# Patient Record
Sex: Male | Born: 1945 | Race: White | Hispanic: No | Marital: Married | Smoking: Former smoker
Health system: Southern US, Community
[De-identification: ages and names within clinical notes are randomized; demographics above are authoritative.]

## PROBLEM LIST (undated history)

## (undated) DIAGNOSIS — I2510 Atherosclerotic heart disease of native coronary artery without angina pectoris: Secondary | ICD-10-CM

## (undated) DIAGNOSIS — G4733 Obstructive sleep apnea (adult) (pediatric): Secondary | ICD-10-CM

## (undated) DIAGNOSIS — K219 Gastro-esophageal reflux disease without esophagitis: Secondary | ICD-10-CM

## (undated) DIAGNOSIS — I10 Essential (primary) hypertension: Secondary | ICD-10-CM

## (undated) DIAGNOSIS — E785 Hyperlipidemia, unspecified: Secondary | ICD-10-CM

## (undated) DIAGNOSIS — Z9889 Other specified postprocedural states: Secondary | ICD-10-CM

## (undated) DIAGNOSIS — C61 Malignant neoplasm of prostate: Secondary | ICD-10-CM

## (undated) HISTORY — DX: Hyperlipidemia, unspecified: E78.5

## (undated) HISTORY — DX: Obstructive sleep apnea (adult) (pediatric): G47.33

## (undated) HISTORY — DX: Essential (primary) hypertension: I10

## (undated) HISTORY — DX: Malignant neoplasm of prostate: C61

## (undated) HISTORY — PX: CORONARY STENT PLACEMENT: SHX1402

## (undated) HISTORY — DX: Other specified postprocedural states: Z98.890

## (undated) HISTORY — PX: TOTAL HIP ARTHROPLASTY: SHX124

## (undated) HISTORY — DX: Atherosclerotic heart disease of native coronary artery without angina pectoris: I25.10

---

## 2000-11-25 HISTORY — PX: CORONARY ARTERY BYPASS GRAFT: SHX141

## 2000-12-08 ENCOUNTER — Encounter: Payer: Self-pay | Admitting: Cardiology

## 2000-12-08 ENCOUNTER — Ambulatory Visit (HOSPITAL_COMMUNITY): Admission: RE | Admit: 2000-12-08 | Discharge: 2000-12-09 | Payer: Self-pay | Admitting: Cardiology

## 2001-10-29 ENCOUNTER — Inpatient Hospital Stay (HOSPITAL_COMMUNITY): Admission: AD | Admit: 2001-10-29 | Discharge: 2001-11-07 | Payer: Self-pay | Admitting: Cardiology

## 2001-11-02 ENCOUNTER — Encounter: Payer: Self-pay | Admitting: Thoracic Surgery (Cardiothoracic Vascular Surgery)

## 2001-11-03 ENCOUNTER — Encounter: Payer: Self-pay | Admitting: Thoracic Surgery (Cardiothoracic Vascular Surgery)

## 2001-11-04 ENCOUNTER — Encounter: Payer: Self-pay | Admitting: Thoracic Surgery (Cardiothoracic Vascular Surgery)

## 2001-11-05 ENCOUNTER — Encounter: Payer: Self-pay | Admitting: Cardiology

## 2001-11-17 ENCOUNTER — Inpatient Hospital Stay (HOSPITAL_COMMUNITY): Admission: EM | Admit: 2001-11-17 | Discharge: 2001-11-19 | Payer: Self-pay | Admitting: *Deleted

## 2001-11-17 ENCOUNTER — Encounter: Payer: Self-pay | Admitting: *Deleted

## 2001-11-17 ENCOUNTER — Encounter: Payer: Self-pay | Admitting: Cardiology

## 2002-06-06 ENCOUNTER — Encounter: Payer: Self-pay | Admitting: Emergency Medicine

## 2002-06-06 ENCOUNTER — Emergency Department (HOSPITAL_COMMUNITY): Admission: EM | Admit: 2002-06-06 | Discharge: 2002-06-06 | Payer: Self-pay | Admitting: Emergency Medicine

## 2002-08-15 ENCOUNTER — Observation Stay (HOSPITAL_COMMUNITY): Admission: EM | Admit: 2002-08-15 | Discharge: 2002-08-16 | Payer: Self-pay | Admitting: Emergency Medicine

## 2004-09-05 ENCOUNTER — Observation Stay (HOSPITAL_COMMUNITY): Admission: EM | Admit: 2004-09-05 | Discharge: 2004-09-06 | Payer: Self-pay | Admitting: Emergency Medicine

## 2005-03-26 ENCOUNTER — Inpatient Hospital Stay (HOSPITAL_COMMUNITY): Admission: RE | Admit: 2005-03-26 | Discharge: 2005-03-30 | Payer: Self-pay | Admitting: Orthopedic Surgery

## 2005-09-06 ENCOUNTER — Emergency Department (HOSPITAL_COMMUNITY): Admission: EM | Admit: 2005-09-06 | Discharge: 2005-09-06 | Payer: Self-pay | Admitting: Emergency Medicine

## 2006-12-29 ENCOUNTER — Ambulatory Visit: Payer: Self-pay | Admitting: Internal Medicine

## 2007-01-12 ENCOUNTER — Ambulatory Visit: Payer: Self-pay | Admitting: Internal Medicine

## 2007-01-12 ENCOUNTER — Ambulatory Visit: Payer: Self-pay

## 2007-01-12 LAB — CONVERTED CEMR LAB
ALT: 51 units/L — ABNORMAL HIGH (ref 0–40)
AST: 40 units/L — ABNORMAL HIGH (ref 0–37)
Albumin: 3.7 g/dL (ref 3.5–5.2)
Alkaline Phosphatase: 53 units/L (ref 39–117)
BUN: 17 mg/dL (ref 6–23)
Bilirubin, Direct: 0.1 mg/dL (ref 0.0–0.3)
CO2: 30 meq/L (ref 19–32)
Calcium: 9.3 mg/dL (ref 8.4–10.5)
Chloride: 105 meq/L (ref 96–112)
Cholesterol: 139 mg/dL (ref 0–200)
Creatinine, Ser: 0.8 mg/dL (ref 0.4–1.5)
GFR calc Af Amer: 128 mL/min
GFR calc non Af Amer: 106 mL/min
Glucose, Bld: 96 mg/dL (ref 70–99)
HDL: 41.6 mg/dL (ref >39.0)
LDL Cholesterol: 78 mg/dL (ref 0–99)
Potassium: 4.4 meq/L (ref 3.5–5.1)
Sodium: 142 meq/L (ref 135–145)
Total Bilirubin: 0.6 mg/dL (ref 0.3–1.2)
Total CHOL/HDL Ratio: 3.3
Total Protein: 6.8 g/dL (ref 6.0–8.3)
Triglycerides: 97 mg/dL (ref 0–149)
VLDL: 19 mg/dL (ref 0–40)

## 2007-02-09 ENCOUNTER — Ambulatory Visit: Payer: Self-pay | Admitting: Pulmonary Disease

## 2007-03-20 ENCOUNTER — Ambulatory Visit: Payer: Self-pay | Admitting: Pulmonary Disease

## 2007-03-20 ENCOUNTER — Ambulatory Visit (HOSPITAL_BASED_OUTPATIENT_CLINIC_OR_DEPARTMENT_OTHER): Admission: RE | Admit: 2007-03-20 | Discharge: 2007-03-20 | Payer: Self-pay | Admitting: Pulmonary Disease

## 2007-04-13 ENCOUNTER — Ambulatory Visit: Payer: Self-pay | Admitting: Pulmonary Disease

## 2007-06-15 ENCOUNTER — Ambulatory Visit: Payer: Self-pay | Admitting: Internal Medicine

## 2007-06-15 LAB — CONVERTED CEMR LAB
ALT: 92 units/L — ABNORMAL HIGH (ref 0–53)
AST: 66 units/L — ABNORMAL HIGH (ref 0–37)
Albumin: 3.7 g/dL (ref 3.5–5.2)
Alkaline Phosphatase: 56 units/L (ref 39–117)
BUN: 17 mg/dL (ref 6–23)
Bilirubin, Direct: 0.1 mg/dL (ref 0.0–0.3)
CO2: 32 meq/L (ref 19–32)
Calcium: 9 mg/dL (ref 8.4–10.5)
Chloride: 104 meq/L (ref 96–112)
Cholesterol: 128 mg/dL (ref 0–200)
Creatinine, Ser: 1 mg/dL (ref 0.4–1.5)
GFR calc Af Amer: 99 mL/min
GFR calc non Af Amer: 82 mL/min
Glucose, Bld: 95 mg/dL (ref 70–99)
HDL: 37.3 mg/dL — ABNORMAL LOW (ref >39.0)
LDL Cholesterol: 66 mg/dL (ref 0–99)
Potassium: 4.2 meq/L (ref 3.5–5.1)
Sodium: 141 meq/L (ref 135–145)
Total Bilirubin: 0.8 mg/dL (ref 0.3–1.2)
Total CHOL/HDL Ratio: 3.4
Total Protein: 6.8 g/dL (ref 6.0–8.3)
Triglycerides: 126 mg/dL (ref 0–149)
VLDL: 25 mg/dL (ref 0–40)

## 2007-07-13 ENCOUNTER — Ambulatory Visit: Payer: Self-pay | Admitting: Internal Medicine

## 2008-01-18 ENCOUNTER — Ambulatory Visit: Payer: Self-pay | Admitting: Internal Medicine

## 2008-02-01 ENCOUNTER — Ambulatory Visit: Payer: Self-pay | Admitting: Internal Medicine

## 2008-02-01 LAB — CONVERTED CEMR LAB
ALT: 63 units/L — ABNORMAL HIGH (ref 0–53)
AST: 52 units/L — ABNORMAL HIGH (ref 0–37)
Albumin: 3.6 g/dL (ref 3.5–5.2)
Alkaline Phosphatase: 51 units/L (ref 39–117)
BUN: 12 mg/dL (ref 6–23)
Bilirubin, Direct: 0.2 mg/dL (ref 0.0–0.3)
CO2: 31 meq/L (ref 19–32)
Calcium: 9.2 mg/dL (ref 8.4–10.5)
Chloride: 104 meq/L (ref 96–112)
Cholesterol: 105 mg/dL (ref 0–200)
Creatinine, Ser: 0.9 mg/dL (ref 0.4–1.5)
GFR calc Af Amer: 111 mL/min
GFR calc non Af Amer: 92 mL/min
Glucose, Bld: 111 mg/dL — ABNORMAL HIGH (ref 70–99)
HDL: 33.5 mg/dL — ABNORMAL LOW (ref >39.0)
LDL Cholesterol: 54 mg/dL (ref 0–99)
PSA: 3.34 ng/mL (ref 0.10–4.00)
Potassium: 4.2 meq/L (ref 3.5–5.1)
Sodium: 141 meq/L (ref 135–145)
Total Bilirubin: 1.1 mg/dL (ref 0.3–1.2)
Total CHOL/HDL Ratio: 3.1
Total Protein: 6.7 g/dL (ref 6.0–8.3)
Triglycerides: 87 mg/dL (ref 0–149)
VLDL: 17 mg/dL (ref 0–40)

## 2008-09-19 ENCOUNTER — Ambulatory Visit: Payer: Self-pay | Admitting: Internal Medicine

## 2008-10-03 ENCOUNTER — Ambulatory Visit: Payer: Self-pay | Admitting: Internal Medicine

## 2008-10-03 LAB — CONVERTED CEMR LAB
ALT: 53 units/L (ref 0–53)
AST: 41 units/L — ABNORMAL HIGH (ref 0–37)
Albumin: 3.8 g/dL (ref 3.5–5.2)
Alkaline Phosphatase: 58 units/L (ref 39–117)
BUN: 14 mg/dL (ref 6–23)
Bilirubin, Direct: 0.2 mg/dL (ref 0.0–0.3)
CO2: 30 meq/L (ref 19–32)
Calcium: 9.3 mg/dL (ref 8.4–10.5)
Chloride: 105 meq/L (ref 96–112)
Cholesterol: 106 mg/dL (ref 0–200)
Creatinine, Ser: 0.9 mg/dL (ref 0.4–1.5)
GFR calc Af Amer: 111 mL/min
GFR calc non Af Amer: 91 mL/min
Glucose, Bld: 98 mg/dL (ref 70–99)
HDL: 27.5 mg/dL — ABNORMAL LOW (ref >39.0)
LDL Cholesterol: 54 mg/dL (ref 0–99)
Potassium: 4.2 meq/L (ref 3.5–5.1)
Sodium: 141 meq/L (ref 135–145)
Total Bilirubin: 1.3 mg/dL — ABNORMAL HIGH (ref 0.3–1.2)
Total CHOL/HDL Ratio: 3.9
Total Protein: 6.7 g/dL (ref 6.0–8.3)
Triglycerides: 121 mg/dL (ref 0–149)
VLDL: 24 mg/dL (ref 0–40)

## 2009-04-21 ENCOUNTER — Encounter (INDEPENDENT_AMBULATORY_CARE_PROVIDER_SITE_OTHER): Payer: Self-pay | Admitting: *Deleted

## 2009-05-04 ENCOUNTER — Telehealth: Payer: Self-pay | Admitting: Internal Medicine

## 2009-05-15 ENCOUNTER — Encounter: Payer: Self-pay | Admitting: Internal Medicine

## 2009-07-03 ENCOUNTER — Ambulatory Visit: Payer: Self-pay | Admitting: Internal Medicine

## 2009-07-03 DIAGNOSIS — I10 Essential (primary) hypertension: Secondary | ICD-10-CM

## 2009-07-03 DIAGNOSIS — E785 Hyperlipidemia, unspecified: Secondary | ICD-10-CM | POA: Insufficient documentation

## 2009-07-03 DIAGNOSIS — I498 Other specified cardiac arrhythmias: Secondary | ICD-10-CM

## 2009-07-03 DIAGNOSIS — I25810 Atherosclerosis of coronary artery bypass graft(s) without angina pectoris: Secondary | ICD-10-CM

## 2009-07-10 LAB — CONVERTED CEMR LAB
ALT: 36 units/L (ref 0–53)
AST: 33 units/L (ref 0–37)
Albumin: 3.9 g/dL (ref 3.5–5.2)
Alkaline Phosphatase: 55 units/L (ref 39–117)
BUN: 15 mg/dL (ref 6–23)
Bilirubin, Direct: 0.1 mg/dL (ref 0.0–0.3)
CO2: 30 meq/L (ref 19–32)
Calcium: 9.3 mg/dL (ref 8.4–10.5)
Chloride: 106 meq/L (ref 96–112)
Cholesterol: 118 mg/dL (ref 0–200)
Creatinine, Ser: 0.8 mg/dL (ref 0.4–1.5)
GFR calc non Af Amer: 104.5 mL/min (ref >60)
Glucose, Bld: 91 mg/dL (ref 70–99)
HDL: 36.6 mg/dL — ABNORMAL LOW (ref >39.00)
LDL Cholesterol: 64 mg/dL (ref 0–99)
Potassium: 4.2 meq/L (ref 3.5–5.1)
Sodium: 141 meq/L (ref 135–145)
Total Bilirubin: 0.8 mg/dL (ref 0.3–1.2)
Total CHOL/HDL Ratio: 3
Total Protein: 7.3 g/dL (ref 6.0–8.3)
Triglycerides: 89 mg/dL (ref 0.0–149.0)
VLDL: 17.8 mg/dL (ref 0.0–40.0)

## 2009-09-18 ENCOUNTER — Encounter: Payer: Self-pay | Admitting: Internal Medicine

## 2009-10-05 ENCOUNTER — Telehealth: Payer: Self-pay | Admitting: Internal Medicine

## 2010-01-01 ENCOUNTER — Encounter: Payer: Self-pay | Admitting: Internal Medicine

## 2010-07-09 ENCOUNTER — Ambulatory Visit: Payer: Self-pay | Admitting: Internal Medicine

## 2010-07-23 ENCOUNTER — Encounter: Payer: Self-pay | Admitting: Internal Medicine

## 2010-07-23 ENCOUNTER — Ambulatory Visit: Payer: Self-pay | Admitting: Internal Medicine

## 2010-07-23 ENCOUNTER — Ambulatory Visit (HOSPITAL_COMMUNITY): Admission: RE | Admit: 2010-07-23 | Discharge: 2010-07-23 | Payer: Self-pay | Admitting: Internal Medicine

## 2010-07-23 ENCOUNTER — Ambulatory Visit: Payer: Self-pay | Admitting: Cardiology

## 2010-07-23 ENCOUNTER — Ambulatory Visit: Payer: Self-pay

## 2010-08-03 LAB — CONVERTED CEMR LAB
ALT: 59 units/L — ABNORMAL HIGH (ref 0–53)
AST: 45 units/L — ABNORMAL HIGH (ref 0–37)
Albumin: 4 g/dL (ref 3.5–5.2)
Alkaline Phosphatase: 60 units/L (ref 39–117)
BUN: 16 mg/dL (ref 6–23)
Basophils Absolute: 0 10*3/uL (ref 0.0–0.1)
Basophils Relative: 0.5 % (ref 0.0–3.0)
Bilirubin, Direct: 0.1 mg/dL (ref 0.0–0.3)
CO2: 29 meq/L (ref 19–32)
Calcium: 9.6 mg/dL (ref 8.4–10.5)
Chloride: 102 meq/L (ref 96–112)
Cholesterol: 215 mg/dL — ABNORMAL HIGH (ref 0–200)
Creatinine, Ser: 0.8 mg/dL (ref 0.4–1.5)
Direct LDL: 144.9 mg/dL
Eosinophils Absolute: 0.1 10*3/uL (ref 0.0–0.7)
Eosinophils Relative: 1 % (ref 0.0–5.0)
GFR calc non Af Amer: 98.43 mL/min (ref >60)
Glucose, Bld: 89 mg/dL (ref 70–99)
HCT: 43.9 % (ref 39.0–52.0)
HDL: 38.5 mg/dL — ABNORMAL LOW (ref >39.00)
Hemoglobin: 15.1 g/dL (ref 13.0–17.0)
Lymphocytes Relative: 21.5 % (ref 12.0–46.0)
Lymphs Abs: 2 10*3/uL (ref 0.7–4.0)
MCHC: 34.4 g/dL (ref 30.0–36.0)
MCV: 95.6 fL (ref 78.0–100.0)
Monocytes Absolute: 0.9 10*3/uL (ref 0.1–1.0)
Monocytes Relative: 9.3 % (ref 3.0–12.0)
Neutro Abs: 6.2 10*3/uL (ref 1.4–7.7)
Neutrophils Relative %: 67.7 % (ref 43.0–77.0)
Platelets: 219 10*3/uL (ref 150.0–400.0)
Potassium: 4.9 meq/L (ref 3.5–5.1)
RBC: 4.59 M/uL (ref 4.22–5.81)
RDW: 13 % (ref 11.5–14.6)
Sodium: 140 meq/L (ref 135–145)
Total Bilirubin: 0.7 mg/dL (ref 0.3–1.2)
Total CHOL/HDL Ratio: 6
Total Protein: 7.1 g/dL (ref 6.0–8.3)
Triglycerides: 265 mg/dL — ABNORMAL HIGH (ref 0.0–149.0)
VLDL: 53 mg/dL — ABNORMAL HIGH (ref 0.0–40.0)
WBC: 9.2 10*3/uL (ref 4.5–10.5)

## 2010-08-13 ENCOUNTER — Ambulatory Visit: Payer: Self-pay | Admitting: Cardiovascular Disease

## 2010-09-26 ENCOUNTER — Telehealth (INDEPENDENT_AMBULATORY_CARE_PROVIDER_SITE_OTHER): Payer: Self-pay | Admitting: *Deleted

## 2010-10-12 ENCOUNTER — Telehealth (INDEPENDENT_AMBULATORY_CARE_PROVIDER_SITE_OTHER): Payer: Self-pay | Admitting: Pharmacist

## 2010-10-17 ENCOUNTER — Ambulatory Visit: Payer: Self-pay | Admitting: Internal Medicine

## 2010-10-17 DIAGNOSIS — E7800 Pure hypercholesterolemia, unspecified: Secondary | ICD-10-CM | POA: Insufficient documentation

## 2010-10-22 ENCOUNTER — Ambulatory Visit: Payer: Self-pay | Admitting: Cardiology

## 2010-10-22 DIAGNOSIS — E782 Mixed hyperlipidemia: Secondary | ICD-10-CM

## 2010-10-23 LAB — CONVERTED CEMR LAB
ALT: 56 units/L — ABNORMAL HIGH (ref 0–53)
AST: 41 units/L — ABNORMAL HIGH (ref 0–37)
Albumin: 4.3 g/dL (ref 3.5–5.2)
Alkaline Phosphatase: 56 units/L (ref 39–117)
Bilirubin, Direct: 0.2 mg/dL (ref 0.0–0.3)
Cholesterol: 139 mg/dL (ref 0–200)
HDL: 47 mg/dL (ref >39)
Indirect Bilirubin: 0.6 mg/dL (ref 0.0–0.9)
LDL Cholesterol: 69 mg/dL (ref 0–99)
PSA: 0.26 ng/mL (ref <=4.00)
Total Bilirubin: 0.8 mg/dL (ref 0.3–1.2)
Total CHOL/HDL Ratio: 3
Total CK: 195 units/L (ref 7–232)
Total Protein: 6.7 g/dL (ref 6.0–8.3)
Triglycerides: 116 mg/dL (ref <150)
VLDL: 23 mg/dL (ref 0–40)

## 2010-12-25 NOTE — Progress Notes (Signed)
  Phone Note Call from Patient   Caller: Patient Call For: Lipid Clinic Summary of Call: Patient called to reschedule his lipid labwork.  I have rescheduled it for Wednesday, Nov 23rd, and patient has appt with Lipid clinic on Monday, Nov 29th.  He was given Crestor 10 mg samples at his initial lipid visit but will run out of these the Monday before his labwork, so I have instructed patient to pick up an additional 7 tablets when he comes in on Wednesday for labwork.  He was instructed to take these every MWF, so they will last him through Monday when he will be in for his lipid clinic visit.   Initial call taken by: Eda Keys,  October 12, 2010 8:48 AM

## 2010-12-25 NOTE — Miscellaneous (Signed)
Summary: Orders Update  Clinical Lists Changes  Problems: Added new problem of SPECIAL SCREENING MALIGNANT NEOPLASM OF PROSTATE (ICD-V76.44) Orders: Added new Test order of TLB-PSA (Prostate Specific Antigen) (84153-PSA) - Signed

## 2010-12-25 NOTE — Assessment & Plan Note (Signed)
Summary: new/hyperlipidemia   [name] presents to lipid clinic today. He has no complaints, but has had several statin intolerances in the past. He has previously tried Lipitor which caused him severe chest pain. He also had tried crestor (40 mg) before but had severe cramps in his hands and legs. The most recent statin he tried was pitavastatin which caused cramps in his legs and hands as well. 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 For breakfast he either eats a bacon, eggs, and cheese biscuit or cereal with black coffee. He frequently skips lunch. For dinner, he has soup and salad or baked chicken. 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 This makes exercising difficult.   Lipid Management Provider  Lyna Poser, PharmD  Preventive Screening-Counseling & Management  Caffeine-Diet-Exercise     Diet Counseling: to improve diet; diet is suboptimal     Does Patient Exercise: yes     Type of exercise: walking     Exercise Counseling: to improve exercise regimen  Medications Prior to Update: 1)  Enalapril Maleate 20 Mg Tabs (Enalapril Maleate) .... Take 1/2 Tab in Am, 1 Tab in Pm 2)  Metoprolol Tartrate 50 Mg Tabs (Metoprolol Tartrate) .... Take 1/2 Tab Two Times A Day 3)  Spironolactone-Hctz 25-25 Mg Tabs (Spironolactone-Hctz) .... 1/2 Tab  Once Daily 4)  Aspirin 81 Mg Tbec (Aspirin) .... Take One Tablet By Mouth Daily 5)  Mobic 15 Mg Tabs (Meloxicam) .... As Needed 6)  Fish Oil   Oil (Fish Oil) .... 3000mg  Once Daily 7)  Allegra 180 Mg Tabs (Fexofenadine Hcl) .... As Needed 8)  Zetia 10 Mg Tabs (Ezetimibe) .... Take One Tablet By Mouth Daily.  Current Medications (verified): 1)  Enalapril Maleate 20 Mg Tabs (Enalapril Maleate) .... Take 1/2 Tab in Am, 1 Tab in Pm 2)  Metoprolol Tartrate  50 Mg Tabs (Metoprolol Tartrate) .... Take 1/2 Tab Two Times A Day 3)  Spironolactone-Hctz 25-25 Mg Tabs (Spironolactone-Hctz) .... 1/2 Tab  Once Daily 4)  Aspirin 81 Mg Tbec (Aspirin) .... Take One Tablet By Mouth Daily 5)  Mobic 15 Mg Tabs (Meloxicam) .... As Needed 6)  Fish Oil   Oil (Fish Oil) .... 3000mg  in Am and 3000 Mg in Pm 7)  Allegra 180 Mg Tabs (Fexofenadine Hcl) .... As Needed 8)  Zetia 10 Mg Tabs (Ezetimibe) .... Take One Tablet By Mouth Daily. 9)  Crestor 10 Mg Tabs (Rosuvastatin Calcium) .... Take 1 Tablet On Monday, Wednesday, and Friday  Allergies: 1)  ! Codeine 2)  ! * Ambien 3)  ! * Statins  Past History:  Past Medical History: Last updated: 07/09/2010 1) CAD - s/p LAD wire perforation followed by CABG with LIMA-LAD 2002     --Treadmil 9/08. Good exercise tolerance. No ischemia 2) HTN 3) HL      --intolerant of all statins due to muscle aches 4) Prostate CA s/p resection 5) h/o OSA resolved with weight loss  Past Surgical History: Last updated: 06/28/2009 The patient had cardiac bypass surgery in 2002.  Colonoscopy with polypectomy.  Family History: Last updated: 06/28/2009 Significant for father with diabetes, coronary artery  disease, and hypertension. Sister died of cancer - type unknown. Mother had  arthritis.  Social History: Last updated: 06/28/2009  He lives in  [place] with his wife and he works as a Ecologist.  He has four sons.  He quit smoking in 1989 with approximately a 40  Social History: Does Patient Exercise:  yes   Vital Signs:  Patient profile:   65 year old male Pulse rate:   64 / minute BP supine:   125 / 87  Impression & Recommendations:  Problem # 1:  HYPERLIPIDEMIA-MIXED (ICD-272.4) TC 215, TG 265, HDL 38, and LDL 144. Goal LDL <70. Patient had been previously on crestor 40 mg. Discussed whether he wished to try the crestor again and take it 3 days/week or try Tricor. He agreed to try the crestor at a reduced dose  of 10 mg and to take it just on monday, wednesday, and friday. He had previously tried Co-Q10 before with past statins. He stated that they helped but didn't make the cramps go away. Encouraged him to get some more to help with the cramps. Counseled that if the muscle cramps become intolerable, to make sure and give [place] a call and we we will try Tricor instead. Counseled on lifestyle modifications. Encouraged him to exercise 30 minutes per day if possible. 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 Encouraged him to break up the workout into 10 minute increments, if he cannot exercise the full 30 minutes at one time. Counseled on diet and established goal of decreasing fast food, increasing fruits and vegetables, and eat leaner meats such as chicken or fish and to eat baked, broiled, or steamed food rather than fried. F/u in 2 months to recheck lipid panel and LFT's.  His updated medication list for this problem includes:    Zetia 10 Mg Tabs (Ezetimibe) .Marland Kitchen... Take one tablet by mouth daily.    Crestor 10 Mg Tabs (Rosuvastatin calcium) .Marland Kitchen... Take 1 tablet on monday, wednesday, and friday  Patient Instructions: 1)  Take the crestor 10 mg samples on monday, wednesday, friday 2)  Restart Co-Q10 that you had tried before 3)  Decrease fatty foods, eat more fruits and vegetables 4)  Exercise 30 minutes per day 5)  We'll see you in 2 month to check some labs 6)  Give [place] a call if the cramping becomes severe enough that you can't tolerate the crestor anymore

## 2010-12-25 NOTE — Progress Notes (Signed)
  All recent records faxed to [facility] Clinic @ [phone] [facility]  September 26, 2010 11:41 AM

## 2010-12-25 NOTE — Assessment & Plan Note (Signed)
Summary: per check out/sf   Visit Type:  Follow-up Primary Provider:  Tomasa Blase, MD  CC:  no cardiac complaints.  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  He returns, today, for routine followup. Past medical history is also notable for obesity and obstructive sleep apnea with intolerance to CPAP .   From cardiac standpoint doing very well no sob. Does have CP due to gas relieved with belching. No exertional CP. Switched from lipitor to crestor due to severe chest pains with lipitor. Couldn't tolerate Crestor either due to severe hand cramps. Switched to pitavastatin and also couldn't tolerate. Last LDL was 86 in 2/11   Notes that he has very easy bruising over the past few months. No nosebleeds or gingival bleeding.  Active at work but no regular exercise. No need for CPAP after weight loss. BP well controlled. No syncope or presyncope.  Current Medications (verified): 1)  Enalapril Maleate 20 Mg Tabs (Enalapril Maleate) .... Take 1/2 Tab in Am, 1 Tab in Pm 2)  Metoprolol Tartrate 50 Mg Tabs (Metoprolol Tartrate) .... Take 1/2 Tab Two Times A Day 3)  Spironolactone-Hctz 25-25 Mg Tabs (Spironolactone-Hctz) .... 1/2 Tab  Once Daily 4)  Aspirin 81 Mg Tbec (Aspirin) .... Take One Tablet By Mouth Daily 5)  Mobic 15 Mg Tabs (Meloxicam) .... As Needed 6)  Fish Oil   Oil (Fish Oil) .... 3000mg  Once Daily 7)  Allegra 180 Mg Tabs (Fexofenadine Hcl) .... As Needed 8)  Zetia 10 Mg Tabs (Ezetimibe) .... Take One Tablet By Mouth Daily.  Allergies: 1)  ! Codeine 2)  ! * Ambien 3)  ! * Statins  Past History:  Past Surgical History: Last updated: 06/28/2009 The patient had cardiac bypass surgery in 2002.  Colonoscopy with polypectomy.  Family  History: Last updated: 06/28/2009 Significant for father with diabetes, coronary artery  disease, and hypertension. Sister died of cancer - type unknown. Mother had  arthritis.  Social History: Last updated: 06/28/2009  He lives in [place] with his wife and he works as a Ecologist.  He has four sons.  He quit smoking in 1989 with approximately a 40  Past Medical History: 1) CAD - s/p LAD wire perforation followed by CABG with LIMA-LAD 2002     --Treadmil 9/08. Good exercise tolerance. No ischemia 2) HTN 3) HL      --intolerant of all statins due to muscle aches 4) Prostate CA s/p resection 5) h/o OSA resolved with weight loss  Review of Systems       As per HPI and past medical history; otherwise all systems negative.   Vital Signs:  Patient profile:   65 year old male Height:      72 inches Weight:      216 pounds BMI:     29.40 Pulse rate:   68 / minute BP sitting:   118 / 70  (left arm) Cuff size:   regular  Vitals Entered By: Hardin Negus, RMA (July 09, 2010 2:32 PM)  Physical Exam  General:  Gen: well appearing. no resp difficulty HEENT: normal Neck: supple. no JVD. Carotids 2+ bilat; no bruits. No lymphadenopathy or thryomegaly appreciated. Cor: PMI nondisplaced. Bradycardic and regular. No rubs, gallops, murmur. Lungs: clear Abdomen: soft, nontender, nondistended.  No hepatosplenomegaly. No bruits or masses. Good bowel sounds. Extremities: no cyanosis, clubbing, rash, edema Neuro: alert & orientedx3, cranial nerves grossly intact. moves all 4 extremities w/o difficulty. affect pleasant    Impression & Recommendations:  Problem # 1:  CAD, ARTERY BYPASS GRAFT (ICD-414.04) Stable. No evidence of ischemia. Continue current regimen. Check CBC to make sure platelets ok. Will check echo to quantify LV function.   Problem # 2:  HYPERLIPIDEMIA-MIXED (ICD-272.4) Unable to tolerate all statins. Continue zetia. Due for lipid recheck.   Other Orders: EKG  w/ Interpretation (93000) Echocardiogram (Echo)  Patient Instructions: 1)  Your physician recommends that you return for a FASTING lipid, liver, bmet, and cbc profile:  2)  Your physician has requested that you have an echocardiogram.  Echocardiography is a painless test that uses sound waves to create images of your heart. It provides your doctor with information about the size and shape of your heart and how well your heart's chambers and valves are working.  This procedure takes approximately one hour. There are no restrictions for this procedure. 3)  Your physician wants you to follow-up in:  1 year.  You will receive a reminder letter in the mail two months in advance. If you don't receive a letter, please call our office to schedule the follow-up appointment.

## 2010-12-25 NOTE — Assessment & Plan Note (Signed)
Summary: rov/sp   [name] presents to lipid clinic today for a follow up visit. He is complaining of urinary issues, specifically burning and pain. He has seen one urologist and now is seeking a second opinion after 2 rounds of antibiotics and a rocephin injection. Patient states that after completion of antibiotics, the symptoms would go away but have now returned today. He is not complaining of any muscle aches. Occasionally he gets cramps but they are minor in severity and he says they are nothing compared to when he was on the 40 mg of crestor. His diet has improved. He states he has been avoiding fried foods but did cheat on thanksgiving. He is eating more chicken, [place], salads, and has a freezer full of deer meat. Exercise has not improved. He still has a lot of issues with his hip and may be getting a hip replacement at the beginning of the year. 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  Lipid Management Provider  Lyna Poser, PharmD  Current Medications (verified): 1)  Enalapril Maleate 20 Mg Tabs (Enalapril Maleate) .... Take 1/2 Tab in Am, 1 Tab in Pm 2)  Metoprolol Tartrate 50 Mg Tabs (Metoprolol Tartrate) .... Take 1/2 Tab Two Times A Day 3)  Spironolactone-Hctz 25-25 Mg Tabs (Spironolactone-Hctz) .... 1/2 Tab  Once Daily 4)  Aspirin 81 Mg Tbec (Aspirin) .... Take One Tablet By Mouth Daily 5)  Mobic 15 Mg Tabs (Meloxicam) .... As Needed 6)  Fish Oil   Oil (Fish Oil) .... 3000mg  in Am and 3000 Mg in Pm 7)  Allegra 180 Mg Tabs (Fexofenadine Hcl) .... As Needed 8)  Zetia 10 Mg Tabs (Ezetimibe) .... Take One Tablet By Mouth Daily. 9)  Crestor 10 Mg Tabs (Rosuvastatin Calcium) .... Take 1 Tablet On Monday, Wednesday, and Friday  Allergies: 1)  ! Codeine 2)  ! * Ambien 3)  ! 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 Values have significantly improved since last visit and patient is now at goal. Will continue current regimen of crestor 10 mg monday, wednesday, and friday, zetia 10 mg daily, and CoQ10 daily. Counseled patient to call if cramps become severe. LFT's are slightly elevated (41 and 56) but have decreased since august. CK WNL. Will continue to monitor. Patient will try to exercise in 10 minute intervals with goal to work up to 30 min/day 5 days/week. Diet has improved since last visit and patient agreed to continue this. f/u 3 months.   His updated medication list for this problem includes:    Zetia 10 Mg Tabs (Ezetimibe) .Marland Kitchen... Take one tablet by mouth daily.    Crestor 10 Mg Tabs (Rosuvastatin calcium) .Marland Kitchen... Take 1 tablet on monday, wednesday, and friday  Patient Instructions: 1)  Continue crestor 10 mg monday, wednesday, and friday 2)  Continue zetia 10 mg and CoQ10 daily 3)  exercise 10 minute intervals with goal of 30 min/day 5 days/week 4)  lab appointment february 20th 8:30 am (fasting) 5)  lipid clinic february 27th 1:30 pm

## 2011-01-14 ENCOUNTER — Encounter: Payer: Self-pay | Admitting: Cardiology

## 2011-01-14 ENCOUNTER — Other Ambulatory Visit (INDEPENDENT_AMBULATORY_CARE_PROVIDER_SITE_OTHER): Payer: 59

## 2011-01-14 ENCOUNTER — Other Ambulatory Visit: Payer: Self-pay | Admitting: Cardiology

## 2011-01-14 DIAGNOSIS — Z79899 Other long term (current) drug therapy: Secondary | ICD-10-CM

## 2011-01-14 DIAGNOSIS — E785 Hyperlipidemia, unspecified: Secondary | ICD-10-CM

## 2011-01-14 LAB — HEPATIC FUNCTION PANEL
ALT: 59 U/L — ABNORMAL HIGH (ref 0–53)
AST: 33 U/L (ref 0–37)
Albumin: 3.8 g/dL (ref 3.5–5.2)
Alkaline Phosphatase: 56 U/L (ref 39–117)
Bilirubin, Direct: 0.2 mg/dL (ref 0.0–0.3)
Total Bilirubin: 0.5 mg/dL (ref 0.3–1.2)
Total Protein: 7 g/dL (ref 6.0–8.3)

## 2011-01-14 LAB — LIPID PANEL
Cholesterol: 144 mg/dL (ref 0–200)
HDL: 51.1 mg/dL (ref >39.00)
LDL Cholesterol: 68 mg/dL (ref 0–99)
Total CHOL/HDL Ratio: 3
Triglycerides: 126 mg/dL (ref 0.0–149.0)
VLDL: 25.2 mg/dL (ref 0.0–40.0)

## 2011-01-21 ENCOUNTER — Ambulatory Visit (INDEPENDENT_AMBULATORY_CARE_PROVIDER_SITE_OTHER): Payer: 59

## 2011-01-21 ENCOUNTER — Encounter: Payer: Self-pay | Admitting: Cardiology

## 2011-01-21 DIAGNOSIS — E7800 Pure hypercholesterolemia, unspecified: Secondary | ICD-10-CM

## 2011-01-21 DIAGNOSIS — Z79899 Other long term (current) drug therapy: Secondary | ICD-10-CM

## 2011-01-30 ENCOUNTER — Telehealth: Payer: Self-pay | Admitting: Internal Medicine

## 2011-01-31 NOTE — Assessment & Plan Note (Signed)
Summary: ROV/LIPID/KH   Primary Provider:  Tomasa Blase, MD   History of Present Illness: Mr [name] presents to lipid clinic today for a follow up visit. He complains of muscle cramps in legs that wake him up about once a night 3x/week. He says they are nothing compared to when he was on the 40 mg of crestor. He also says the only other complaint he has is the taste of the fish oil, but that it doesn't really bother him and he can deal with it. He states he changed brands due to a sale and his previous brand had no fishy after taste.   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 He states that he has started taking Osflex and can now walk/exercise more than before. He tries to get some walking in on the weekend and states that if he had to total it up, he probablyl walks about 30 minutes total per week. 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  The patient states that he has an irregular eating schedule due to his work. He states that it is good if he gets to eat 2 meals/day. For breakfast he eats a sausage and egg biscuit and for his other meal, he usually eats whatever is around where he stops and it could be a soup and salad bar, [place], or [place] food. When at the [employer], he usually picks the fried chicken options. He states he eats more chicken than beef. For snacks, the patient eats things like apples, fiber bars, and pistachios. He also states he tries to stay away from sodas and drinks only coffee, tea, and water. He drinks at minimum > 3 cups of coffee per day and when he is working, he uses a big coffee mug.   The patient is not currently a smoker. He was a previous smoker and quit 24 years ago in June. He also only drinks alcohol socially and says he may come home on a Friday and have 1-2 beers.   Preventive  Screening-Counseling & Management  Alcohol-Tobacco     Alcohol drinks/day: <1     Alcohol type: beer     >5/day in last 3 mos: no     Smoking Status: quit > 6 months  Caffeine-Diet-Exercise     Caffeine use/day: > 3 cups/day     Diet Counseling: to improve diet; diet is suboptimal     Does Patient Exercise: yes     Type of exercise: walking     Exercise (avg: min/session): <30     Times/week: <3     Exercise Counseling: to improve exercise regimen  Comments: Quit 24 years ago.   Current Medications (verified): 1)  Enalapril Maleate 20 Mg Tabs (Enalapril Maleate) .... Take 1/2 Tab in Am, 1 Tab in Pm 2)  Metoprolol Tartrate 50 Mg Tabs (Metoprolol Tartrate) .... Take 1/2 Tab Two Times A Day 3)  Spironolactone-Hctz 25-25 Mg Tabs (Spironolactone-Hctz) .... 1/2 Tab  Once Daily 4)  Aspirin 81 Mg Tbec (Aspirin) .... Take One Tablet By Mouth Daily 5)  Mobic 15 Mg Tabs (Meloxicam) .... As Needed 6)  Fish Oil   Oil (Fish Oil) .... 3000mg  in Am and 3000 Mg in Pm 7)  Allegra 180 Mg Tabs (Fexofenadine Hcl) .... As Needed 8)  Zetia 10 Mg Tabs (Ezetimibe) .... Take One Tablet  By Mouth Daily. 9)  Crestor 10 Mg Tabs (Rosuvastatin Calcium) .... Take 1 Tablet On Mondays and Fridays.  Allergies: 1)  ! Codeine 2)  ! * Ambien 3)  ! * Statins  Past History:  Past Medical History: Last updated: 07/09/2010 1) CAD - s/p LAD wire perforation followed by CABG with LIMA-LAD 2002     --Treadmil 9/08. Good exercise tolerance. No ischemia 2) HTN 3) HL      --intolerant of all statins due to muscle aches 4) Prostate CA s/p resection 5) h/o OSA resolved with weight loss  Past Surgical History: Last updated: 06/28/2009 The patient had cardiac bypass surgery in 2002.  Colonoscopy with polypectomy.  Family History: Last updated: 06/28/2009 Significant for father with diabetes, coronary artery  disease, and hypertension. Sister died of cancer - type unknown. Mother had  arthritis.  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  He has four sons.  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 The patient's TC goal is < 200, TG goal is < 150, and LDL goal is < 70. The patient's ALT is slightly elevated at 59 but has remained consistent (last visits were 56, and 59). Currently, the patient is at goal and at the last visit the patient was also at goal. In the past the patient has had muscle aches/ cramps on Crestor 40mg  and on Lipitor. His only complaint today is that he is starting to have some muscle aches that wake him up at night but that they are no where near as bad as when he was on Crestor 40 mg. The patient is currently on Crestor 10 mg MWF, which we will decrease to Crestor 10 mg just on Mondays and Fridays to see if this will help with the muscle aches/cramps. The patient will continue taking Zetia and Fish Oil. The patient's diet and exercise regimen are irregular due to the patient's type of work and work schedule. The patient was agreeable to try and start walking more now that his hip feels better and will also try to make better diet choices at the restaurants he goes to.   His updated medication list  for this problem includes:    Zetia 10 Mg Tabs (Ezetimibe) .Marland Kitchen... Take one tablet by mouth daily.    Crestor 10 Mg Tabs (Rosuvastatin calcium) .Marland Kitchen... Take 1 tablet on mondays and fridays.    Fish Oil 1000 mg.Marland KitchenMarland KitchenMarland KitchenTake 3000 mg in the AM and 3000 mg in the PM.   Patient Instructions: 1)  Change Crestor to 2x/week on Mondays and Fridays instead of 3x/week on Mondays, Wednesdays, and Fridays 2)  Continue to  tak Zetia and Fish Oil 3)  Try to increase aerobic exercise to 15 minutes 3x/week 4)  Try to pick grilled chicken options or non-fried options at the [employer] and try to pick menu options with less cheese at the [employer] 5)  Your cholesterol levels are currently at goal, GREAT JOB. Keep up the good work.  6)  Follow-up in 4 months

## 2011-02-12 NOTE — Progress Notes (Signed)
Summary: pt needs surgical clearence  Phone Note Call from Patient   Caller: Patient [phone] or fax [phone] Reason for Call: Talk to Nurse Summary of Call: pt needs surgical clearence for hip replacement by dr  Darrelyn Hillock Initial call taken by: Glynda Jaeger,  January 30, 2011 1:45 PM  Follow-up for Phone Call        pt last seen 06/2010, he states he has been doing ok since then, will let Dr Gala Romney review on Fri and call pt back, surgery has not been sch yet Meredith Staggers, RN  January 30, 2011 5:40 PM   Additional Follow-up for Phone Call Additional follow up Details #1::        it has been 10 years since CABG and fx status limited due to hip. Would recommend Lexiscan Myoview. Dolores Patty, MD, [facility]  February 03, 2011 8:15 PM  pt is aware, order placed instructions reviewed w/pt via phone Meredith Staggers, RN  February 04, 2011 4:21 PM

## 2011-02-13 ENCOUNTER — Ambulatory Visit (HOSPITAL_COMMUNITY): Payer: 59 | Attending: Cardiovascular Disease | Admitting: Radiology

## 2011-02-13 VITALS — Ht 72.0 in | Wt 217.0 lb

## 2011-02-13 DIAGNOSIS — I2510 Atherosclerotic heart disease of native coronary artery without angina pectoris: Secondary | ICD-10-CM

## 2011-02-13 DIAGNOSIS — I25810 Atherosclerosis of coronary artery bypass graft(s) without angina pectoris: Secondary | ICD-10-CM

## 2011-02-13 MED ORDER — REGADENOSON 0.4 MG/5ML IV SOLN
0.4000 mg | Freq: Once | INTRAVENOUS | Status: AC
  Administered 2011-02-13: 0.4 mg via INTRAVENOUS

## 2011-02-13 MED ORDER — TECHNETIUM TC 99M TETROFOSMIN IV KIT
30.0000 | PACK | Freq: Once | INTRAVENOUS | Status: AC | PRN
  Administered 2011-02-13: 30 via INTRAVENOUS

## 2011-02-13 MED ORDER — TECHNETIUM TC 99M TETROFOSMIN IV KIT
10.0000 | PACK | Freq: Once | INTRAVENOUS | Status: AC | PRN
  Administered 2011-02-13: 10 via INTRAVENOUS

## 2011-02-13 NOTE — Progress Notes (Signed)
[facility] SITE 3 NUCLEAR MED [address] [place] [address] [phone]  Cardiology Nuclear Med Study [name] male 04/11/1946   Nuclear Med Background Indication for Stress Test:  Evaluation for Ischemia, Surgical Clearance and Graft Patency History:2002 Heart Cath >  CABG x 1,8/11 Echo EF Nml, 2008 GXT,and 2005 Myocardial Perfusion Study Nml Cardiac Risk Factors: Family History - CAD, History of Smoking, Hypertension and Lipids  Symptoms:  None   Nuclear Pre-Procedure Caffeine/Decaff Intake:  None NPO After: 8:00pm   Lungs:  Clear IV 0.9% NS with Angio Cath:  20g  IV Site: R Antecubital  IV Started by:  Bonnita Levan, RN  Chest Size (in):  46 Cup Size:   N/A  Height: 6' (1.829 m)  Weight:  217 lb (98.431 kg)  BMI:  Body mass index is 29.43 kg/(m^2). Tech Comments: Held Metoprolol x 12 hr    Nuclear Med Study 1 or 2 day study: 1 day  Stress Test Type:  Lexiscan  Reading MD: Willa Rough, MD  Order Authorizing Provider:  Dr. Algis Downs. Bensimhon  Resting Radionuclide: Technetium 16m Tetrofosmin  Resting Radionuclide Dose: 11.0 mCi   Stress Radionuclide:  Technetium 21m Tetrofosmin  Stress Radionuclide Dose: 33.0 mCi           Stress Protocol Rest HR: 65 Stress HR: 90  Rest BP: 120/77 Stress BP: 118/78  Exercise Time:  N/A min METS: N/A  Predicted HR: 158 % of Maximum: 56    Predicted Max HR: 158 bpm % Max HR: 56.96 bpm Rate Pressure Product: 95621    Dose of Adenosine:  N/A mg Dose of Lexiscan:  0.4 mg  Dose of Atropine:  N/A mg Dose of Dobutamine:  N/A mcg/kg/min (at max HR)  Stress Test Technologist: Bonnita Levan, RN  Nuclear Technologist:  Domenic Polite, CNMT     Rest Procedure:  Myocardial perfusion imaging was performed at rest 45 minutes following the intravenous administration of Technetium 19m Tetrofosmin. Rest ECG: NSR  Stress Procedure:  The patient received IV Lexiscan 0.4 mg over 15-seconds.  Technetium 28m Tetrofosmin  injected at 30-seconds.  There were no significant changes with Lexiscan.  Quantitative spect images were obtained after a 45 minute delay. Stress ECG: No significant change from baseline ECG  QPS Raw Data Images:  Normal; no motion artifact; normal heart/lung ratio. Stress Images:  Normal homogeneous uptake in all areas of the myocardium. Rest Images:  Normal homogeneous uptake in all areas of the myocardium. Subtraction (SDS):  No evidence of ischemia.  Findings Risk Category:  Normal nuclear study. Clinically Abnormal:  No Ischemia:  No Fixed Defect:  No LV Dysfunction:  No Transient Ischemic Dilatation (Normal <1.22):  .86 Lung/Heart Ratio (Normal <0.45):  .35  Quantitative Gated Spect Images QGS EDV:  78 ml QGS ESV:  23 ml QGS cine images:  Normal QGS EF: 71 %  Impression Exercise Capacity:  Lexiscan with no exercise. BP Response:  Normal blood pressure response. Clinical Symptoms:  light headed ECG Impression:  No significant ST segment change suggestive of ischemia. Comparison with Prior Nuclear Study: No old images  Overall Impression:  Normal stress nuclear study.

## 2011-02-19 ENCOUNTER — Encounter: Payer: Self-pay | Admitting: Cardiology

## 2011-02-19 ENCOUNTER — Telehealth: Payer: Self-pay | Admitting: Internal Medicine

## 2011-02-19 NOTE — Telephone Encounter (Signed)
Pt had myoview on 3/21, will send to Dr Gala Romney for surgical clearance

## 2011-02-19 NOTE — Telephone Encounter (Signed)
error 

## 2011-02-20 ENCOUNTER — Telehealth: Payer: Self-pay | Admitting: Internal Medicine

## 2011-02-20 NOTE — Telephone Encounter (Signed)
Pt aware stress test normal, will have Dr Gala Romney sign off on in AM and fax

## 2011-02-21 NOTE — Progress Notes (Signed)
No ischemia. Ok to proceed with surgery.

## 2011-02-21 NOTE — Telephone Encounter (Signed)
Stress test normal. Ok to proceed without any further cardiac testing.

## 2011-02-22 NOTE — Telephone Encounter (Signed)
myoview w/Dr Bensimhon's comments about surgical clearance faxed

## 2011-03-04 ENCOUNTER — Telehealth: Payer: Self-pay | Admitting: Internal Medicine

## 2011-03-04 NOTE — Telephone Encounter (Signed)
myoview was normal and Dr Gala Romney cleared pt for surgery, myoview report w/his note was faxed then, refaxed today

## 2011-03-05 ENCOUNTER — Other Ambulatory Visit: Payer: Self-pay | Admitting: Orthopedic Surgery

## 2011-03-05 ENCOUNTER — Encounter (HOSPITAL_COMMUNITY): Payer: 59

## 2011-03-05 ENCOUNTER — Ambulatory Visit (HOSPITAL_COMMUNITY)
Admission: RE | Admit: 2011-03-05 | Discharge: 2011-03-05 | Disposition: A | Discharge status: Home / Self Care | Payer: 59 | Source: Ambulatory Visit | Attending: Orthopedic Surgery | Admitting: Orthopedic Surgery

## 2011-03-05 ENCOUNTER — Other Ambulatory Visit (HOSPITAL_COMMUNITY): Payer: Self-pay | Admitting: Orthopedic Surgery

## 2011-03-05 ENCOUNTER — Telehealth: Payer: Self-pay | Admitting: Cardiology

## 2011-03-05 DIAGNOSIS — I10 Essential (primary) hypertension: Secondary | ICD-10-CM

## 2011-03-05 DIAGNOSIS — Z01818 Encounter for other preprocedural examination: Secondary | ICD-10-CM | POA: Insufficient documentation

## 2011-03-05 DIAGNOSIS — Z87891 Personal history of nicotine dependence: Secondary | ICD-10-CM | POA: Insufficient documentation

## 2011-03-05 DIAGNOSIS — I519 Heart disease, unspecified: Secondary | ICD-10-CM

## 2011-03-05 DIAGNOSIS — Z01812 Encounter for preprocedural laboratory examination: Secondary | ICD-10-CM | POA: Insufficient documentation

## 2011-03-05 DIAGNOSIS — Z951 Presence of aortocoronary bypass graft: Secondary | ICD-10-CM | POA: Insufficient documentation

## 2011-03-05 LAB — DIFFERENTIAL
Basophils Absolute: 0.1 10*3/uL (ref 0.0–0.1)
Basophils Relative: 1 % (ref 0–1)
Eosinophils Absolute: 0.1 10*3/uL (ref 0.0–0.7)
Eosinophils Relative: 1 % (ref 0–5)
Lymphocytes Relative: 21 % (ref 12–46)
Lymphs Abs: 2.5 10*3/uL (ref 0.7–4.0)
Monocytes Absolute: 1.2 10*3/uL — ABNORMAL HIGH (ref 0.1–1.0)
Monocytes Relative: 10 % (ref 3–12)
Neutro Abs: 7.8 10*3/uL — ABNORMAL HIGH (ref 1.7–7.7)
Neutrophils Relative %: 67 % (ref 43–77)

## 2011-03-05 LAB — PROTIME-INR
INR: 0.98 (ref 0.00–1.49)
Prothrombin Time: 13.2 seconds (ref 11.6–15.2)

## 2011-03-05 LAB — CBC
HCT: 42.8 % (ref 39.0–52.0)
Hemoglobin: 14.7 g/dL (ref 13.0–17.0)
MCH: 32 pg (ref 26.0–34.0)
MCHC: 34.3 g/dL (ref 30.0–36.0)
MCV: 93 fL (ref 78.0–100.0)
Platelets: 223 10*3/uL (ref 150–400)
RBC: 4.6 MIL/uL (ref 4.22–5.81)
RDW: 13.5 % (ref 11.5–15.5)
WBC: 11.7 10*3/uL — ABNORMAL HIGH (ref 4.0–10.5)

## 2011-03-05 LAB — COMPREHENSIVE METABOLIC PANEL
ALT: 68 U/L — ABNORMAL HIGH (ref 0–53)
AST: 43 U/L — ABNORMAL HIGH (ref 0–37)
Albumin: 3.8 g/dL (ref 3.5–5.2)
Alkaline Phosphatase: 54 U/L (ref 39–117)
BUN: 15 mg/dL (ref 6–23)
CO2: 26 mEq/L (ref 19–32)
Calcium: 9.4 mg/dL (ref 8.4–10.5)
Chloride: 103 mEq/L (ref 96–112)
Creatinine, Ser: 0.79 mg/dL (ref 0.4–1.5)
GFR calc Af Amer: >60 mL/min (ref >60)
GFR calc non Af Amer: >60 mL/min (ref >60)
Glucose, Bld: 87 mg/dL (ref 70–99)
Potassium: 4.1 mEq/L (ref 3.5–5.1)
Sodium: 139 mEq/L (ref 135–145)
Total Bilirubin: 0.6 mg/dL (ref 0.3–1.2)
Total Protein: 7.2 g/dL (ref 6.0–8.3)

## 2011-03-05 LAB — URINALYSIS, ROUTINE W REFLEX MICROSCOPIC
Bilirubin Urine: NEGATIVE
Glucose, UA: NEGATIVE mg/dL
Hgb urine dipstick: NEGATIVE
Ketones, ur: NEGATIVE mg/dL
Nitrite: NEGATIVE
Protein, ur: NEGATIVE mg/dL
Specific Gravity, Urine: 1.022 (ref 1.005–1.030)
Urobilinogen, UA: 0.2 mg/dL (ref 0.0–1.0)
pH: 6.5 (ref 5.0–8.0)

## 2011-03-05 LAB — SURGICAL PCR SCREEN
MRSA, PCR: NEGATIVE
Staphylococcus aureus: NEGATIVE

## 2011-03-05 LAB — APTT: aPTT: 30 seconds (ref 24–37)

## 2011-03-05 NOTE — Telephone Encounter (Signed)
Faxed LOV, EKG & Echo to [facility] Pre-Op ([phone]).

## 2011-03-15 ENCOUNTER — Inpatient Hospital Stay (HOSPITAL_COMMUNITY)
Admission: RE | Admit: 2011-03-15 | Discharge: 2011-03-19 | DRG: 470 | Disposition: A | Discharge status: Home Health | Payer: 59 | Source: Ambulatory Visit | Attending: Orthopedic Surgery | Admitting: Orthopedic Surgery

## 2011-03-15 ENCOUNTER — Inpatient Hospital Stay (HOSPITAL_COMMUNITY): Payer: 59

## 2011-03-15 DIAGNOSIS — I252 Old myocardial infarction: Secondary | ICD-10-CM

## 2011-03-15 DIAGNOSIS — M169 Osteoarthritis of hip, unspecified: Principal | ICD-10-CM | POA: Diagnosis present

## 2011-03-15 DIAGNOSIS — K219 Gastro-esophageal reflux disease without esophagitis: Secondary | ICD-10-CM | POA: Diagnosis present

## 2011-03-15 DIAGNOSIS — Z8546 Personal history of malignant neoplasm of prostate: Secondary | ICD-10-CM

## 2011-03-15 DIAGNOSIS — Z96649 Presence of unspecified artificial hip joint: Secondary | ICD-10-CM

## 2011-03-15 DIAGNOSIS — R112 Nausea with vomiting, unspecified: Secondary | ICD-10-CM | POA: Diagnosis not present

## 2011-03-15 DIAGNOSIS — Z91199 Patient's noncompliance with other medical treatment and regimen due to unspecified reason: Secondary | ICD-10-CM

## 2011-03-15 DIAGNOSIS — Z01812 Encounter for preprocedural laboratory examination: Secondary | ICD-10-CM

## 2011-03-15 DIAGNOSIS — Z9119 Patient's noncompliance with other medical treatment and regimen: Secondary | ICD-10-CM

## 2011-03-15 DIAGNOSIS — G4733 Obstructive sleep apnea (adult) (pediatric): Secondary | ICD-10-CM | POA: Diagnosis present

## 2011-03-15 DIAGNOSIS — I10 Essential (primary) hypertension: Secondary | ICD-10-CM | POA: Diagnosis present

## 2011-03-15 DIAGNOSIS — I2510 Atherosclerotic heart disease of native coronary artery without angina pectoris: Secondary | ICD-10-CM | POA: Diagnosis present

## 2011-03-15 DIAGNOSIS — M1610 Unilateral primary osteoarthritis, unspecified hip: Principal | ICD-10-CM | POA: Diagnosis present

## 2011-03-15 DIAGNOSIS — Z951 Presence of aortocoronary bypass graft: Secondary | ICD-10-CM

## 2011-03-15 DIAGNOSIS — D649 Anemia, unspecified: Secondary | ICD-10-CM | POA: Diagnosis not present

## 2011-03-15 LAB — ABO/RH: ABO/RH(D): O POS

## 2011-03-15 LAB — TYPE AND SCREEN
ABO/RH(D): O POS
Antibody Screen: NEGATIVE

## 2011-03-16 LAB — PROTIME-INR: Prothrombin Time: 15 seconds (ref 11.6–15.2)

## 2011-03-16 LAB — HEMOGLOBIN AND HEMATOCRIT, BLOOD
HCT: 30.6 % — ABNORMAL LOW (ref 39.0–52.0)
Hemoglobin: 10.7 g/dL — ABNORMAL LOW (ref 13.0–17.0)

## 2011-03-17 LAB — HEMOGLOBIN AND HEMATOCRIT, BLOOD: Hemoglobin: 9.6 g/dL — ABNORMAL LOW (ref 13.0–17.0)

## 2011-03-18 LAB — HEMOGLOBIN AND HEMATOCRIT, BLOOD
HCT: 26.9 % — ABNORMAL LOW (ref 39.0–52.0)
Hemoglobin: 9.3 g/dL — ABNORMAL LOW (ref 13.0–17.0)

## 2011-03-18 LAB — PROTIME-INR: INR: 1.92 — ABNORMAL HIGH (ref 0.00–1.49)

## 2011-03-18 NOTE — H&P (Addendum)
NAMEIOANE, BHOLA             ACCOUNT NO.:  [id number]  MEDICAL RECORD NO.:  [id number]           PATIENT TYPE:  I  LOCATION:  1616                         FACILITY:  [facility]  PHYSICIAN:  Georges Lynch. Elias Dennington, M.D.DATE OF BIRTH:  [birth year]  DATE OF ADMISSION:  03/15/2011 DATE OF DISCHARGE:                             HISTORY & PHYSICAL   PRIMARY CARE PHYSICIAN:  Dr. Tomasa Blase.  CARDIOLOGIST:  Bevelyn Buckles. Bensimhon, MD  CHIEF COMPLAINT:  Left hip pain.  BRIEF HISTORY:  Mr. Brunsman has been followed by Dr. Darrelyn Hillock for worsening pain in his left hip.  He has a right total hip arthroplasty also done by Dr. Darrelyn Hillock and he has done very well with that.  He states at this time his left hip is feeling very much as his right hip did prior to surgery.  It is limiting his activity.  It is hurting him at all times.  He now presents for left total hip arthroplasty.  Please note that the patient has been cleared for surgery by his cardiologist, Dr. Gala Romney.  MEDICATION ALLERGIES: 1. CODEINE that causes nausea and vomiting.  The patient states he     does okay with Vicodin. 2. NABUMETONE. 3. AMBIEN.  CURRENT MEDICATIONS: 1. Metoprolol. 2. Enalapril. 3. Spironolactone/hydrochlorothiazide. 4. Crestor. 5. Aspirin. 6. Allegra. 7. Zetia. 8. Fish oil. 9. Coenzyme Q-10. 10.Omeprazole.  PAST MEDICAL HISTORY: 1. End-stage arthritis of left hip. 2. Hearing impairment. 3. History of shingles many years ago. 4. Hypertension. 5. Myocardial infarction occurred during a heart catheterization back     in December 2001. 6. Hyperlipidemia. 7. Heart stenting x2. 8. Reflux disease. 9. Hemorrhoids. 10.Diverticulosis. 11.Prostate disease. 12.History of prostate cancer. 13.History of staph infections. 14.History of cellulitis. 15.History of measles and mumps as a child.  PAST SURGICAL HISTORY: 1. Heart bypass surgery. 2. Right total hip arthroplasty. 3. Prostatectomy for prostate cancer.   He states he has done fine with     anesthesia in the past.  FAMILY HISTORY:  Father passed at the age of 35.  He had heart disease. Mother passed at the age of 43, she had pancreatic cancer.  SOCIAL HISTORY:  The patient is married.  He is a Naval architect.  He admits past use of tobacco products.  He stopped smoking back in 1988. He has 4 children.  Lives at home with his family.  He does plan to go home following his hospital stay.  REVIEW OF SYSTEMS:  GENERAL:  Negative for fevers, chills, or weight change.  HEENT:  Negative for headache, blurred vision, or insomnia. Dermatologic:  Negative for rash or lesion.  RESPIRATORY:  Negative for shortness of breath at rest or with exertion.  CARDIOVASCULAR:  Negative for chest pain or palpitations.  GI:  Negative for nausea, vomiting, or diarrhea.  GU:  Positive for urinating at nighttime.  Negative for hematuria, dysuria.  MUSCULOSKELETAL:  Positive for joint pain.  PHYSICAL EXAMINATION:  VITAL SIGNS:  Pulse 78, respirations 18, blood pressure 122/86 in the left arm. GENERAL:  Mr. Okuda is alert and oriented x3.  He is a pleasant 65- year-old male with a stated height  of 68 and a stated weight of 210 pounds. NECK:  Supple.  Full range of motion without lymphadenopathy. CHEST:  Lungs are clear to auscultation bilaterally without wheezing. HEART:  Regular rate and rhythm without murmur. ABDOMEN:  Bowel sounds present in all 4 quadrants. EXTREMITIES:  Left hip decreased range of motion, pain throughout the range. SKIN:  Unremarkable. NEUROLOGIC:  Intact. PERIPHERAL VASCULAR:  Carotid pulses 2+ bilaterally without bruit.  RADIOGRAPHIC DATA:  AP and lateral views of the left hip reveal advanced end-stage arthritis of the left hip.  IMPRESSION:  End-stage arthritis of the left hip.  PLAN:  Left total hip arthroplasty to be performed by Dr. Darrelyn Hillock.     Rozell Searing, PAC   ______________________________ Georges Lynch Darrelyn Hillock,  M.D.    LD/MEDQ  D:  03/18/2011  T:  03/18/2011  Job:  [id number]  cc:   Dr. Suszanne Finch R. Bensimhon, MD [address] [place] [address]  Electronically Signed by Rozell Searing  on 03/18/2011 09:15:53 AM Electronically Signed by Ranee Gosselin M.D. on 04/05/2011 06:47:23 AM

## 2011-03-19 LAB — PROTIME-INR
INR: 1.82 — ABNORMAL HIGH (ref 0.00–1.49)
Prothrombin Time: 21.2 seconds — ABNORMAL HIGH (ref 11.6–15.2)

## 2011-04-05 NOTE — Op Note (Signed)
NAMEDORRANCE, SELLICK             ACCOUNT NO.:  [id number]  MEDICAL RECORD NO.:  [id number]           PATIENT TYPE:  I  LOCATION:  1616                         FACILITY:  [facility]  PHYSICIAN:  Georges Lynch. Kelleigh Skerritt, M.D.DATE OF BIRTH:  10/05/1946  DATE OF PROCEDURE:  03/15/2011 DATE OF DISCHARGE:                              OPERATIVE REPORT   SURGEON:  Georges Lynch. Darrelyn Hillock, M.D.  ASSISTANT: 1. Madlyn Frankel. Charlann Boxer, M.D. 2. Rozell Searing, [facility].  PREOPERATIVE DIAGNOSES: 1. Severe degenerative arthritis, left hip. 2. Previous total hip on the right.  POSTOPERATIVE DIAGNOSES: 1. Severe degenerative arthritis, left hip. 2. Previous total hip on the right.  OPERATION:  Left total hip arthroplasty utilizing the DePuy system and the sizes used was the cup was an AltrX polyethylene liner +4 neutral, inside diameter 36 mm.  We utilized a pinnacle cup measuring 58 mm in diameter with a 35 mm in length screw for fixation.  We utilized the hole eliminator.  The femoral head was a +5 ceramic head.  As I mentioned, the cup was a size 58 mm outside diameter pinnacle cup.  The femoral stem was a size 9 Tri-Lock stem, high offset.  DESCRIPTION OF PROCEDURE:  Under general anesthesia, the patient on the right side, left side upward, routine orthopedic prepping and draping of the right hip was carried out.  He had 2 g of IV Ancef preop.  At this time, the appropriate time-out was carried out prior to making the incision.  Also on the holding area, I marked the appropriate left hip. At that particular time, a posterolateral approach to the hip was carried out.  Bleeders were identified and cauterized.  I then I inserted self-retaining retractors.  The incision was carried down through the iliotibial band in usual fashion. I Separated the gluteal muscle by blunt dissection.  I detached the external rotators with great care taken to protect the sciatic nerve.  I then did a capsulectomy, dislocated the  femoral head and amputated femoral head neck at the appropriate length.  Note, the femoral head was extremely large and deformed.  Following that, I then utilized the box osteotome to remove the lateral cancellous bone and then the widening reamer and then the canal finder and once this was done I thoroughly irrigated out the femoral canal.  I then rasped the canal up to a size 9 Tri-Lock stem. After that was prepared, we irrigated the canal out again and packed it with a large sponge and then we reamed the acetabulum up to a size 57 for 58-mm pinnacle cup.  We completed the capsulectomy around the cup. We removed all the large osteophytes around the cup.  I also curetted out a large cyst within the acetabulum and packed it with cancellous bone from the femoral neck.  We then inserted our permanent cup after we reamed the acetabulum earlier and the cup as I mentioned then was inserted with one screw for fixation, 35 mm in length.  The hole eliminator was inserted.  We then inserted our permanent AltrX liner. At this time, we then removed the sponge from the femoral canal, irrigated the  canal again and then inserted our size 9 femoral component.  At that time, we then went through trials with a +1 and finally selected a +5 ball.  We had good stability with a +5 ball.  The liner.  The liner we inserted was a +4 neutral.  We then removed the trial ball and then inserted our permanent ceramic head measuring 36 mm in diameter, a +5.  We cleared the acetabulum, reduced the hip and had excellent reduction.  We went through leg lengths during the procedure obviously and we were very happy with the leg length that we had with this +5 ball and it was obviously stapled, +1 ball was unstapled.  We then irrigated the wound out.  I injected 10 cc of FloSeal into the wound site and then inserted 2 large pieces of thrombin-soaked Gelfoam, closed the wound in layers in usual fashion after I repaired a  portion of the capsule that I had left behind.  The subcu was closed with 0 Vicryl, skin with metal staples.  A sterile Neosporin dressing was applied.  The patient left the operating room in satisfactory condition.          ______________________________ Georges Lynch Darrelyn Hillock, M.D.     RAG/MEDQ  D:  03/15/2011  T:  03/16/2011  Job:  [id number]  cc:   Windy Fast A. Darrelyn Hillock, M.D. Fax: [phone]  Bevelyn Buckles. Bensimhon, MD [address] [place] [address]  Electronically Signed by Ranee Gosselin M.D. on 04/05/2011 06:47:22 AM

## 2011-04-09 NOTE — Assessment & Plan Note (Signed)
[facility]                            CARDIOLOGY OFFICE NOTE   WILLIAMSON, CAVANAH                    MRN:          [id number]  DATE:07/13/2007                            DOB:          11/14/1946    PRIMARY CARE PHYSICIAN:  Dr. Foye Deer.   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  He  returns today for routine followup.   He continues to do well.  He denies any chest pain or shortness of  breath.  He is relatively active at work, though he does not exercise  formally.  We have referred him to pulmonary recently and he was found  to have sleep apnea, but he has been intolerant of the mask and is no  longer able to wear it.   MEDICATIONS:  1. Enalapril 20 a day.  2. Metoprolol 50 b.i.d.  3. Vytorin 10/40.  4. Allegra.  5. Spironolactone/hydrochlorothiazide 25/25 a half tablet daily.  6. Fish oil 1000 b.i.d.  7. Aspirin 81.  8. Protonix 40 mg a day.   PHYSICAL EXAM:  He is well-appearing, in no acute distress.  Ambulates  around the clinic without any respiratory difficulty.  Blood pressure is 122/76, heart rate is 60, weight is 230.  HEENT:  Normal.  NECK:  Supple.  No JVD.  Carotids are 2+ bilaterally without any bruits.  There is no lymphadenopathy or thyromegaly.  CARDIAC:  His PMI is not displaced.  He has a regular rate and rhythm  with an S4.  No murmur.  LUNGS:  Clear.  ABDOMEN:  Obese, nontender, nondistended.  No hepatosplenomegaly.  No  bruits.  No masses.  Good bowel sounds.  EXTREMITIES:  Warm with no cyanosis, clubbing, or edema.  Good pulses.  No rash.  NEURO:  Alert and oriented x3.  Cranial nerves 2-12 are intact.  Moves  all 4 extremities without difficulty.  Affect is very  pleasant.   EKG:  Normal sinus rhythm at a rate of 60.  No ST-T wave abnormalities.   ASSESSMENT AND PLAN:  1. Coronary artery disease.  This is quite stable without any evidence      of ischemia.  Continue current therapy.  2. Hypertension, well controlled.  3. Hyperlipidemia.  This is followed by Dr. Tomasa Blase.  I would titrate      his medications to keep his LDL below 70.  4. Obesity and sleep apnea.  I told him that if he is intolerant of      the mask, the best way to treat his sleep apnea is to lose weight.      I suggested that he try to lose 30 pounds over the next year.  I      have given him a goal of 1/2 pound a week.   DISPOSITION:  Return to the clinic in 6 months for followup.  Bevelyn Buckles. Bensimhon, MD  Electronically Signed    DRB/MedQ  DD: 07/13/2007  DT: 07/14/2007  Job #: [id number]   cc:   Dr. Foye Deer

## 2011-04-09 NOTE — Assessment & Plan Note (Signed)
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  He is status post 1-vessel bypass with LIMA to the LAD,  EF receptor wire perforation of the LAD and had a cardiac tamponade.  His medical history is also notable for obesity, obstructive sleep apnea  with intolerance to CPAP.   He returns today for routine followup.  He is doing quite well since his  blood pressure has been well controlled.  He is found to have a mildly  elevated PSA of 4.1 and is pending prostate biopsy.  He has been trying  to be a little bit more active but this has been limited due to severe  left hip pain which requires him to take Mobic every several days.  He  is previously had a right hip replacement.   REVIEW OF SYSTEMS:  Otherwise negative.   CURRENT MEDICATIONS:  1. Metoprolol 50 b.i.d.  2. Allegra 180 a day.  3. Spironolactone/Hydrochlorothiazide 25/25 half tablet daily.  4. Centrum Silver.  5. Fish oil 1000 b.i.d.  6. Aspirin 81 a day.  7. Enalapril 10 mg in the morning and 20 mg at night.  8. Protonix 20 b.i.d.  9. Lipitor 80 a day.   PHYSICAL EXAMINATION:  GENERAL:  He is in no acute distress, ambulatory  in the clinic without respiratory difficulty.  VITAL SIGNS:  Blood pressure is 125/76, heart rate 75, weight is 230.  HEENT:  Normal.  NECK:  Supple and thick.  No obvious JVD.  Carotids are 2+ bilaterally  without any bruits.  There is no lymphadenopathy or thyromegaly.  CARDIAC:  PMI is nondisplaced.  Regular rate and rhythm.  No murmurs,  rubs, or gallops.  LUNGS:  Clear.  ABDOMEN:  Obese, nontender,  nondistended.  No hepatosplenomegaly, no  bruits, no masses.  Good bowel sounds.  EXTREMITIES:  Warm with no  cyanosis, clubbing, or edema.  No rash.  NEUROLOGIC:  Alert and oriented x3.  Cranial nerves II-XII are intact.  Moves all 4 extremities without difficulty.  Affect is pleasant.   EKG shows sinus rhythm at a rate of 67.  No ST-T wave abnormalities.   ASSESSMENT AND PLAN:  1. Coronary artery disease is stable.  He has no evidence of ischemia.      Continue current therapy chronic.  2. Hypertension, well controlled.  3. Hyperlipidemia.  He is due for repeat lipids and liver panel.  We      will check these next week.  4. Obesity.  He need to be more active and lose weight as well as      diet.   DISPOSITION:  Overall, he is doing very well from a cardiac standpoint.  We will see him back  in 9 months for routine followup.     Bevelyn Buckles. Bensimhon, MD  Electronically Signed    DRB/MedQ  DD: 09/19/2008  DT: 09/20/2008  Job #: [id number]   cc:   Foye Deer, MD

## 2011-04-09 NOTE — Assessment & Plan Note (Signed)
[facility]                            CARDIOLOGY OFFICE NOTE   Alexander, Blackburn                    MRN:          [id number]  DATE:01/18/2008                            DOB:          1946/09/05    PRIMARY CARE PHYSICIAN:  Dr. Barney Drain.   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  He  returns, today, for routine followup.   PAST MEDICAL HISTORY:  Also notable for obesity and obstructive sleep  apnea with intolerance to CPAP .  Overall he is doing very well.  He  denies any chest pain or shortness of breath.  He does note that in the  early mornings his systolic blood pressures are often in the 150 range;  however, they are in the 110-120s the rest of the day.  He has taken  several extra doses of enalapril.   CURRENT MEDICATIONS INCLUDE:  1. Enalapril 10 mg b.i.d.  2. Metoprolol 50 b.i.d.  3. Vytorin 10/40.  4. Allegra 180 a day.  5. Spirolactone/HCTZ 25/25 one-half tablet a day.  6. Centrum Silver.  7. Fish oil 1000 b.i.d.  8. Aspirin 81  9. Protonix 40 a day.   PHYSICAL EXAM:  He is well-appearing in no acute distress.  Moves all  four extremities without difficulty.  Affect is very pleasant.  Blood pressure is 118/82; heart rate is 56; weight is 224.  HEENT is normal.  NECK:  Supple.  No JVD.  Carotid 2+ bilaterally bruits.  There is no  lymphadenopathy or thyromegaly.  CARDIAC:  His PMI is nondisplaced.  Regular rate and rhythm with an S4  no murmur.  LUNGS:  Clear.  ABDOMEN:  Obese, nontender, nondistended.  No hepatosplenomegaly, no  bruits, no masses.  Good bowel sounds  EXTREMITIES:  Warm with no sinus clubbing or edema.  Good pulses.  No  rash.  NEURO:  Alert and oriented x3.  Cranial  nerves II-XII are intact.  Moves  all four extremities without difficulty.  Affect is very pleasant.   ASSESSMENT AND PLAN:  1. Coronary artery disease.  He is doing well without any evidence of      ischemia.  EKG is unchanged.  Continue current therapy.  2. Hypertension.  His morning blood pressures are a bit elevated, I      suspect that this may be related to sleep apnea,  In any events, we      will increase his nighttime enalapril to 20 mg so he is on 10 in      the morning and 20 at night.  I told him to take it as late as      possible before bedtime.  3. Hyperlipidemia.  His insurance will no longer pay for Vytorin.  His      LDL is actually a goal.  We will switch him over to Lipitor 80 and  recheck his lipids.  4. Obstructive sleep apnea.  He is intolerant to CPAP.  I have, once      again, asked him to do his best to try and loose at least 30 pounds      to see if this would alleviate his symptoms and help of his blood      pressure as well.   DISPOSITION:  We will see him back in clinic in 6 months.     Bevelyn Buckles. Bensimhon, MD  Electronically Signed    DRB/MedQ  DD: 01/18/2008  DT: 01/19/2008  Job #: [id number]

## 2011-04-09 NOTE — Assessment & Plan Note (Signed)
[facility]                             PULMONARY OFFICE NOTE   [name], [name]                    MRN:          [id number]  DATE:04/13/2007                            DOB:          [birth year]    I saw Mr. Heidenreich today in followup for his obstructive sleep apnea.   He had undergone a split night study on March 20, 2007.  During the  diagnostic portion of the study he was found to have apnea-hypopnea  index of 15 with an oxygen saturation of 81%.  During the therapeutic  portion of the study he was titrated to a CPAP pressure setting of 7  with a reduction of his apnea-hypopnea index to zero, and at this  pressure setting he was observed in both REM sleep and supine sleep.   He has since been started on CPAP at 7 cm of water with a full face mask  and heated humidification.  He says that he has been feeling  claustrophobic while using it and as a result he has only used it for  about an hour or two each night.  He got his machine about 3 days ago.  He apparently was tried initially on a nasal mask, but due to the fact  that he has a mustache, he was having a lot of leak from this and his  mouth was opening.  As a result he was changed to a full face mask  although he never was actually given a chin strap with his nasal mask.   I have reviewed the results of his sleep study with him in detail.  I  had again discussed with him the importance of sleep apnea with regards  to his cardiovascular health problems.  I had also discussed with him  that there are several options with his type of face mask.  I had also  reviewed the proper fitting of his face mask, as well as several  techniques to help him acclimatize to the use of the CPAP.  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  I had also reviewed other options for treating his sleep apnea including  oral appliance and surgical intervention, but he would prefer to  continue with CPAP therapy for the meantime.   I plan on following up with him in approximately 6-8 weeks, but have  advised him to call me earlier if he is still having a great deal of  difficulty adjusting to the use of CPAP therapy.     Coralyn Helling, MD  Electronically Signed    VS/MedQ  DD: 04/13/2007  DT: 04/13/2007  Job #: [id number]   cc:   Bevelyn Buckles. Bensimhon, MD

## 2011-04-12 NOTE — Discharge Summary (Signed)
[place]  Patient:    [name], [name] Visit Number: [id number] MRN: [id number]          Service Type: MED Location: 2000 2028 01 Attending Physician:  Learta Codding Dictated by:   Guy Franco, P.A.-C. [facility] Admit Date:  11/17/2001 Disc. Date: 11/19/01   CC:         Harl Bowie, M.D., [place], [place]  Paulina Fusi, [place]. [name], M.D.   Referring Physician Discharge [place]  DISCHARGE DIAGNOSES: 1. Atypical chest pain. 2. Premature ventricular contractions. 3. Coronary artery disease status post coronary artery bypass graft 11/02/01. 4. Hypertension. 5. Hyperlipidemia. 6. Gastroesophageal reflux disease. 7. Postoperative atrial fibrillation treated with amiodarone which    spontaneously resolved. 8. Remote tobacco use.  HOSPITAL COURSE:  Mr. Parson is a 65 year old male with known coronary artery disease, who underwent cardiac catheterization at [facility] on 11/02/01, where he was found to have a total LAD.  His EF at that time was 60%.  Dr. Juanda Chance then proceeded PTCA with resultant perforation.  The patient underwent emergent coronary artery bypass graft x 1 on 11/02/01 by Dr. [name]. He experienced postoperative atrial fibrillation.  This spontaneously resolved after short-term amiodarone.  On the day of admission, he complained of lightheadedness while seated.  A few hours later, he had quick, sharp pains in his chest associated with lots of belching.  Pain worse with deep breathing. Excessive gas pains.  The patient was admitted to [facility] and monitored on telemetry. Laboratory studies revealed BNP of 618.  For this reason, Dr. Andee Lineman has ordered a 2-D echocardiogram prior to the patients departure to assess for any LV dysfunction that may have resulted in this elevated level.  The following day a BNP was obtained and remained elevated.  His other lab studies revealed a hemoglobin of 10.4, hematocrit  29.8, BUN 13, creatinine 0.9.  LFTs within normal limits.  After a two day stay in the hospital, no further work-up was felt to be needed.  The patients serial cardiac enzymes and troponin levels were negative, and the patient was prepared for discharge to home.  The patient has had a 2-D echocardiogram which was performed on 11/19/01 just prior to his discharge.  This has not been read as of yet.  The patient has remained stable and will be discharged to home.  DISCHARGE MEDICATIONS: 1. Protonix 40 mg 1 p.o. q.d.  This is in place of his Zantac. 2. He is to hold his Zocor and Tricor for at least 2 more weeks and will    discuss this with his cardiologist next week.  Our concern is that his    increased indigestion/gas pain could have been secondary to his    cholesterol medications. 3. He is to resume his Niferex 150 mg q.d. 4. Enteric-coated aspirin 325 mg q.d. 5. Lopressor 25 mg 1 p.o. b.i.d. 6. Ambien 5 mg 1 p.o. q.h.s. as needed for sleep.  ACTIVITY:  He is to gradually increase his activity as instructed through cardiac rehab.  Remain on a low fat, low cholesterol diet.  Clean over his incision with soap and water.  Keep his follow-up appointments with Dr. [name] on 11/30/01 and with Dr. Sherlyn Lick on 11/24/01. Dictated by:   Guy Franco, P.A.-C. [facility] Attending Physician:  Learta Codding DD:  11/19/01 TD:  11/19/01 Job: [id number]

## 2011-04-12 NOTE — H&P (Signed)
NAMEDURREL, MCNEE                       ACCOUNT NO.:  [id number]   MEDICAL RECORD NO.:  [id number]                   PATIENT TYPE:  OBV   LOCATION:  2021                                 FACILITY:  [facility]   PHYSICIAN:  Carolin Sicks, M.D. [facility]       DATE OF BIRTH:  May 26, 1946   DATE OF ADMISSION:  08/15/2002  DATE OF DISCHARGE:  08/16/2002                                HISTORY & PHYSICAL   CARDIOLOGIST:  Everardo Beals. Juanda Chance, M.D. [facility]   CHIEF COMPLAINT:  Chest pain.   HISTORY OF PRESENT ILLNESS:  The patient is a 65 year old white male with a  history of coronary artery disease (status post CABG x 1, LIMA to LAD on  11/02/01 following failed PCI of the LAD) who presents with atypical chest  pain. He was in his usual state of health until four days ago when he began  to have left-sided chest pain with arm movement and with autopalpation. He  states that this pain continued and did not bother him tremendously, but he  did notice some sharp pain today that lasted for a few seconds and was  associated with left arm discomfort. This was nonpleuritic and  nonpositional. He denied dyspnea, diaphoresis, nausea, vomiting,  palpitations or presyncope.   PAST MEDICAL HISTORY:  1. Coronary disease with catheterization on 11/02/01 revealing a total LAD.     He underwent attempted PCI of that LAD resulting in perforation. The     patient had an urgent LIMA to the LAD placed by Dr. [name]. His EF is     approximately 60%.  2. Postoperative atrial fibrillation.  3. History of zoster.  4. Hypertension.  5. Hyperlipidemia.  6. Gastroesophageal reflux.   ALLERGIES:  HE STATES THAT CODEINE CAUSES NAUSEA.   MEDICATIONS:  1. Protonix 40 mg q.d.  2. Lopressor 25 mg b.i.d.  3. Zocor 40 mg q.h.s.  4. Aspirin 81 mg q.d.  5. Garlic.   SOCIAL HISTORY:  He lives locally. He did smoke cigarettes, but quit 14  years ago.   FAMILY HISTORY:  Significant for coronary disease in his mother and in  his  father.   REVIEW OF SYSTEMS:  Significant only for the left-sided chest wall pain and  occasional gastroesophageal reflux. He is a full code.   PHYSICAL EXAMINATION:  VITAL SIGNS:  Temperature 98.4, blood pressure  122/70, heart rate 60 and regular. He was saturating 94% on room air.  GENERAL:  He was well developed and in no acute distress.  HEENT:  Normal.  NECK:  Supple without jugular venous distention or bruits.  CARDIOVASCULAR:  Revealed a regular rate and rhythm without gallop or  murmur.  LUNGS:  Clear.  EXTREMITIES:  Revealed good distal pulses and no significant edema. He did  have some left chest wall tenderness on palpation.   Chest x-ray which showed no air space disease. His ECG revealed normal sinus  rhythm with no evidence of ST  or T wave abnormalities.   LABORATORY DATA:  His white count was 8.4, hemoglobin 14.1, hematocrit 40.9,  platelets 214. Sodium was 141, potassium 3.9, chloride 108, bicarbonate 24,  BUN 12, creatinine 0.8. Glucose 88. CK was 209. MB was 2.7. Troponin I was  0.04.   ASSESSMENT/PLAN:  1. Atypical chest pain. He does seem to have an underlying neuropathic pain     since his LIMA graft was inserted. This pain that began four days seems     to be musculoskeletal. We have admitted the patient  for observation and     will rule him out for myocardial infarction. We will likely schedule an     exercise Cardiolite to assess for ischemia. We may consider Neurontin for     this chronic chest wall pain. We may also consider opiates or     nonsteroidal inflammatories for this acute musculoskeletal pain.  2. Secondary prevention. The patient is on aspirin, beta blocker, and     statin. We will continue these medicines. However, hold the beta blocker     for anticipation of a stress test. I have started the patient on ramipril     5 mg q.d. for his hypertension as well as for secondary prevention.                                                Carolin Sicks, M.D. [facility]    DRB/MEDQ  D:  08/16/2002  T:  08/18/2002  Job:  [id number]

## 2011-04-12 NOTE — Consult Note (Signed)
[place]. [facility]  Patient:    [name], [name] Visit Number: [id number] MRN: [id number]          Service Type: MED Location: [phone] Attending Physician:  Talitha Givens Dictated by:   Salvatore Decent [name], M.D. Proc. Date: 11/02/01 Admit Date:  10/29/2001   CC:         [name], M.D. [facility]  Lewayne Bunting, M.D. [facility]   Consultation Report  REQUESTING PHYSICIAN:  Everardo Beals. Juanda Chance, M.D. [facility]  PRIMARY CARDIOLOGIST:  Harl Bowie, M.D.  PRIMARY CARE PHYSICIAN:  Dr. Veatrice Kells in [place].  REASON FOR CONSULTATION:  Acute coronary artery perforation with pericardial tamponade status post attempted percutaneous coronary intervention.  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 The patient underwent repeat angioplasty for an occluded left anterior descending coronary artery at the site of previous intervention in January of this year following an abnormal stress test. He now was readmitted to the hospital on December 5th with acute onset of substernal chest pain consistent with unstable angina and borderline elevated cardiac enzymes. The patient was stabilized medically and brought to the cath lab today were a coronary arteriography was performed by Dr. Juanda Chance. This demonstrates repeat occlusions of the left anterior descending coronary artery at the site of previous angioplasty. The remaining left circumflex and right coronary systems appeared to have no significant disease and left ventricular function appears preserved. The patient subsequently underwent an attempt at coronary atherectomy for percutaneous revascularization of the left anterior descending coronary artery. This procedure was complicated by an acute perforation of the coronary artery. He  subsequently developed hypotension and a transthoracic echocardiogram performed in the cath lab demonstrates a new large pericardial effusion consistent with acute blood. The perforation was initially controlled inflation of her intracoronary balloon and the patients hemodynamics stabilized with intravenous Dopamine administration. His anticoagulation was reversed and an emergency cardiac surgical consultation was requested. The patient remains in the cath lab and is awake and alert, and denies ongoing chest pain. He is currently stable hemodynamically in normal sinus rhythm with blood pressure maintaining at least 100 millimeters of mercury systolic.  Past medical history, past surgical history, family history, social history, and medications prior to admission are all documented well in the patients chart at the time of his admission. These have all been reviewed.  PHYSICAL EXAMINATION:  GENERAL:  The patient is awake and alert.  VITAL SIGNS:  Maintaining normal sinus rhythm with blood pressure 115/80.  RESPIRATORY:  Breathing is unlabored. Notching saturation maintaining 88 to 90% on nasal canula oxygen. Breath sounds are clear and symmetrical bilaterally.  CARDIOVASCULAR:  Demonstrates distant heart sounds, but no murmurs, rubs, or gallop are noted.  ABDOMEN:  Soft and nontender.  EXTREMITIES:  Warm and well perfused. Pulses are palpable in both lower legs at the ankle.  The remainder of his physical exam is noncontributory.  DIAGNOSTIC TESTS:  Reviewed and notable for findings as previously described. The patient has severe one vessel coronary artery disease with repeat occlusion of the left anterior descending coronary artery. Following attempt at percutaneous coronary intervention, there appears to be free flow of contrast into the pericardial space emanating from the distal left anterior descending coronary artery. Left ventricular function  perserved.  IMPRESSION:  Acute perforation status post percutaneous coronary intervention complicated by pericardial  tamponade.  PLAN:  We plan to proceed to the operating room emergently as soon as the operating room is available.   cc:  Harl Bowie, M.D. Dictated by:   Salvatore Decent [name], M.D. Attending Physician:  Talitha Givens DD:  11/02/01 TD:  11/02/01 Job: [id number]

## 2011-04-12 NOTE — Procedures (Signed)
[facility]                              EXERCISE TREADMILL   [name], [name]                    MRN:          [id number]  DATE:01/12/2007                            DOB:          Dec 09, 1945    PRIMARY CARE PHYSICIAN:  Dr. Tomasa Blase, in [place].   PATIENT IDENTIFICATION:  [name] is a delightful 65 year old male  with a history of coronary artery disease status post previous bypass in  2001.  He presents today for routine followup stress test.   BASELINE DATA:  EKG shows normal sinus rhythm with T wave inversions  inferiorly.  Heart rate was 74.  Resting blood pressure 125/84.   EXERCISE DATA:  Patient walked for 9 minutes on a standard Bruce  protocol, reaching a peak work load of 10.4 METS with a peak heart rate  of 151 b.p.m., which was 93% of age-predicted maximum.  There was no  chest pain or shortness of breath, test was stopped due to fatigue.  Peak blood pressure was 183/81.  There was no significant ST-T wave  changes with exercise.  There was brisk heart rate recovery with his  peak heart rate going from 151 to 127 in one minute.   CONCLUSION:  1. This is a normal exercise treadmill test with good exercise      capacity.  There is no evidence of stress-induced ischemia.  2. Good heart rate recovery.   Continue current regimen.  Follow up with me as scheduled.     Bevelyn Buckles. Bensimhon, MD  Electronically Signed    DRB/MedQ  DD: 01/12/2007  DT: 01/12/2007  Job #: [id number]   cc:   Barney Drain, MD

## 2011-04-12 NOTE — Discharge Summary (Signed)
[place]  Patient:    CLAUDIA, ALVIZO Visit Number: [id number] MRN: [id number]          Service Type: MED Location: 2000 2033 01 Attending Physician:  Talitha Givens Dictated by:   Areta Haber, P.A.C. Admit Date:  10/29/2001 Disc. Date: 11/07/01   CC:         Harl Bowie, M.D., [place], [place]  Everardo Beals. Juanda Chance, M.D. [facility]   Discharge Summary  HISTORY OF PRESENT ILLNESS:   This is a 65 year old white male with a history of coronary artery disease who had an onset of left-sided chest pain on the evening prior to admission that he scaled a 3 on scale of 1 to 10.  The pain lasted approximately 30 minutes with no significant exertion.  On the a.m. of admission the pain did return and nitroglycerin x 1 relieved the pain but approximately one hour later, the patient had episode of presyncope with a flushed feeling and generalized malaise along with chest pain and no nitroglycerin was used at that time.  The patient was seen at [facility] and was placed on nitroglycerin and became pain-free.  The patient was transferred to [facility] for further evaluation and treatment and the pain returned but the nitroglycerin was adjusted.  The patient had no shortness of breath, positive nausea, positive diaphoresis, felt to be similar to symptoms he experienced in 1995 at which time he had an atherectomy of his coronary artery.  The patient was admitted for further evaluation and treatment.  PAST MEDICAL HISTORY:  Coronary artery disease having undergone previous PTCA and atherectomy procedures in 1995 and again in 2002 for 100% lesions of the LAD.  The right coronary artery and circumflex were felt to be unremarkable and previous studies  and ejection fraction at that time was normal.  Other diagnoses include hypertension, hyperlipidemia, remote history of tobacco abuse, positive family history of coronary artery disease,  history of atherectomy and PTCA of LAD in 1995.  PAST SURGICAL HISTORY:  Polypectomy of colon polyps.  He apparently had a repeat study which was negative after that procedure.  For family history, social history, review of systems, and physical examination please see the history and physical done at the time of admission.  MEDICATIONS ON ADMISSION: 1. Zantac 150 b.i.d. 2. Zocor 80 mg q.d. 3. Altace 5 mg q.d. 4. Tricor 54 mg q.d. 5. Lopressor 50 mg one half b.i.d.  HOSPITAL COURSE:  The patient was admitted for chest pain, felt to be cardiac in nature, for further evaluation and treatment including rule out myocardial infarction and overall stabilization. The patient was placed on heparin in addition to the nitroglycerin and enzymes were obtained.  The patient ruled out for myocardial infarction but was felt to require cardiac catheterization which was done on November 02, 2001.  This was done by [name], M.D. Findings included severe 100% occlusion of the LAD.  Attempted atherectomy was undertaken but this lead to an acute perforation of the LAD which subsequently lead to pericardial tamponade and surgical consultation was obtained promptly. Salvatore Decent. [name], M.D., evaluated the patient and planned emergency median sternotomy for evacuation of the pericardial tamponade, repair of the perforated coronary artery and possible coronary artery bypass grafting of the affected artery.  PROCEDURE:  On November 02, 2001, the patient was taken promptly to the operating suite where he underwent emergency coronary artery bypass grafting x 1 with left internal mammary artery to  the LAD.  Additionally, evacuation of the pericardial tamponade and repair of perforated coronary artery.  The patient tolerated the procedure well and was taken to the surgical intensive care unit in stable condition.  POSTOPERATIVE HOSPITAL COURSE:  The patient has done well. He maintained stable  hemodynamics.  He did have a slight elevation of his CK-MB bands, but no acute changes on EKG.  Additionally, postoperatively, the patient had postoperative atrial fibrillation which subsequently improved with chemical cardioversion with amiodarone and digoxin.  He now maintains normal sinus rhythm. Also during the postoperative period the patient had a postoperative anemia which has been followed closely.  He has stabilized and been started on iron with most recent values, hemoglobin 8.1 and hematocrit 23 on October 27, 2001.  The patient has been weaned from oxygen and maintains good saturations on room air. His incisions are healing well without signs of infection.  He is tolerating cardiac rehabilitation phase I modalities.  He is overall felt to be quite stable for tentative discharge in the morning of November 07, 2001, pending morning round reevaluation.  MEDICATIONS ON DISCHARGE:  1. Aspirin 325 mg q.d.  2. Pepcid 20 mg b.i.d.  3. Zocor 80 mg q.h.s.  4. Tricor 54 mg b.i.d.  5. Amiodarone 400 mg b.i.d. for one week and then 400 mg q.d.  6. Lasix 40 mg daily for 14 days.  7. Potassium chloride 20 mEq daily for 14 days.  8. Niferex 150 mg b.i.d.  9. Lopressor 25 mg b.i.d. 10. Tylox one to two q.4-6h. p.r.n. as needed for pain.  DISCHARGE INSTRUCTIONS:  The patient received written instructions in regard to medications, activity, diet, wound care and follow-up.  FOLLOW-UP:  Malkiat Dhatt, M.D., in two weeks, Salvatore Decent. [name], M.D., in three weeks.  FINAL DIAGNOSIS:  Coronary artery disease, status post previous percutaneous transluminal coronary angioplasties 1995 and 2002. Status post perforation of the left anterior descending coronary artery this hospitalization with emergent coronary artery bypass grafting.  OTHER DIAGNOSES: 1. Hypertension. 2. Dyslipidemia. 3. Remote tobacco. 4. History of colon polypectomy. 5. Postoperative atrial fibrillation.  6. Postoperative  anemia.  CONDITION ON DISCHARGE:  Stable and improving. Dictated by:   Areta Haber, P.A.C. Attending Physician:  Talitha Givens DD:  11/06/01 TD:  11/06/01 Job: [id number]

## 2011-04-12 NOTE — H&P (Signed)
NAMEDAMAREA, MERKEL             ACCOUNT NO.:  [id number]   MEDICAL RECORD NO.:  [id number]          PATIENT TYPE:  INP   LOCATION:  NA                           FACILITY:  [facility]   PHYSICIAN:  Georges Lynch. Gioffre, M.D.DATE OF BIRTH:  1946/02/10   DATE OF ADMISSION:  DATE OF DISCHARGE:                                HISTORY & PHYSICAL   HISTORY:  The patient has had bilateral hip pain for the past several years.  Symptoms have gotten worse over the past few months. Right is much worse  than the left. He was on Mobic initially and that helped with his symptoms  but that is no longer giving him relief. 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   ALLERGIES:  1.  CODEINE causes nausea.  2.  NAPROXEN and NABUMETONE causes high blood pressure.   PRIMARY CARE Kristee Angus:  Dr. Tomasa Blase in [place].   PAST MEDICAL HISTORY:  Significant for seasonal-type allergies,  hypertension, high cholesterol, coronary artery disease, arthritis, and  gastroesophageal reflux disease.   CURRENT MEDICATIONS:  1.  Lopressor 50 mg one tablet twice daily.  2.  Protonix 40 mg daily.  3.  Vasotec 20 mg daily.  4.  Vytorin 10/40 mg daily.  5.  Allegra 60 mg daily.   PREVIOUS SURGICAL HISTORY:  The patient had cardiac bypass surgery in 2002.   FAMILY HISTORY:  Significant for father with diabetes, coronary artery  disease, and hypertension. Sister died of cancer - type unknown. Mother had  arthritis.   REVIEW OF SYSTEMS:  GENERAL:  Denies weight change, fever, chills, fatigue.  HEENT:  Denies headache, visual changes, tinnitus, hearing loss, sore  throat. CARDIOVASCULAR:  Denies chest pain, palpitation, shortness of  breath, orthopnea. PULMONARY:  Denies dyspnea, wheezing, cough, sputum  production, hemoptysis. GI:  Denies dysphagia, nausea, vomiting,  hematemesis, or abdominal pain. GU:  Denies dysuria, frequency,  urgency,  hematuria. ENDOCRINE:  Denies polyuria, polydipsia, appetite change, heat or  cold intolerance. MUSCULOSKELETAL:  The patient has bilateral hip pain.  NEUROLOGIC:  Denies dizziness, vertigo, syncope, seizures. SKIN:  Denies  itching, rashes, masses, or moles.   PHYSICAL EXAMINATION:  VITAL SIGNS:  Temperature is 98.1, pulse is 60,  respirations 18, blood pressure 130/80.  GENERAL:  A 65 year old male, no acute distress.  HEENT:  PERRL, EOMs intact. Pharynx clear.  NECK:  Supple without masses.  CHEST:  Clear to auscultation bilaterally. No wheezing, rales, or rhonchi  noted.  HEART:  Regular rate and rhythm without murmur.  ABDOMEN:  Positive bowel sounds, soft, nontender. He does have an antalgic  gait and restricted range of motion of his right hip. Leg lengths are equal.  SKIN:  Warm and dry.   X-ray of his right hip shows lateral spurs, superior lateral acetabular  region.   IMPRESSION:  Degenerative arthritis right hip.   PLAN:  The patient is to be admitted to [facility] Mar 26, 2005 to  undergo a right total hip arthroplasty.  LKP/MEDQ  D:  03/25/2005  T:  03/25/2005  Job:  [id number]

## 2011-04-12 NOTE — H&P (Signed)
[place]. [facility]  Patient:    [name], [name] Visit Number: [id number] MRN: [id number]          Service Type: MED Location: [phone] Attending Physician:  Talitha Givens Dictated by:   Lewayne Bunting, M.D. [facility] Admit Date:  10/29/2001   CC:         Harl Bowie, M.D., [address] [place], [place]  [address]  Dr. Hazle Nordmann, [place]   History and Physical  DATE OF BIRTH:  1946/03/17  REFERRING PHYSICIAN:  Dr. Sherlyn Lick, [place], [place].  CHIEF COMPLAINT:  Substernal chest pain.  HISTORY OF PRESENT ILLNESS:  [name] is a 65 year old white male with multiple risk factors for coronary artery disease.  The patient is status post prior rotational atherectomy of the proximal left anterior descending artery in 1995.  He then underwent a PTCA and stenting of a proximal total occlusion of the left anterior descending in January 2002.  In addition, he also underwent PTCA of a mid left anterior descending artery by Dr. Juanda Chance.  The patient has been doing well since.  However, over the last two days he developed recurrent substernal chest pain.  Yesterday, particularly during exertion, he had chest pain off and on, usually lasting 15-20 minutes.  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  There was also associated nausea and shortness of breath.  He presented himself to the [facility] ER where he was given nitroglycerin with resolution of his symptoms.  He states the symptoms were similar to his symptoms in 1995.  He is currently pain-free.  He reports no orthopnea, PND.  He has no palpitations or syncope.  ALLERGIES:  CODEINE.  MEDICATIONS: 1. Zantac 150 mg p.o. b.i.d. 2. Zocor 80 mg a day. 3. Altace 5 mg a day. 4. Tricor 54 mg p.o. q.d. 5. Lopressor 25 mg p.o. b.i.d.  PAST MEDICAL HISTORY: 1. Status post cardiac catheterization, with details as  outlined above.    January 2002, by Dr. Juanda Chance, 100% proximally occluded LAD, reduced to 10%    with PTCA and stent, and PTCA of the mid LAD reduced from 90 to 20%.    Normal left main coronary artery and ______  significant disease in the    circumflex right coronary artery.  Ejection fraction 60%. 2. History of colonoscopy and polypectomy. 3. History of dyslipidemia. 4. History of hypertension.  FAMILY HISTORY:  Mother is alive but has coronary artery disease.  Father died of a myocardial infarction.  SOCIAL HISTORY:  The patient is married.  Quit tobacco in 1988.  Has a more than 40-pack-year history.  He is a IT trainer.  He does not drink alcohol.  He has lost about 40 pounds in weight over the last several months on a diet.  REVIEW OF SYSTEMS:  Positive for GERD.  No melena, hemoptysis.  Prior history of hematemesis secondary to hemorrhoids, none recently.  No URI symptoms.  No abdominal pain.  No dysuria or frequency.  PHYSICAL EXAMINATION:  VITAL SIGNS:  Blood pressure 116/56, heart rate 58 beats per minute, respirations 20, saturation 96% on room air.  GENERAL:  Well-nourished white male in no apparent distress.  HEENT:  PERRLA.  EOMI.  NECK:  Supple.  No JVD or abdominojugular bruits.  No thyromegaly.  CHEST:  Clear bilaterally.  HEART:  Regular rate and rhythm.  S4.  No murmurs.  ABDOMEN:  Soft  and nontender.  No rebound or guarding.  Good bowel sounds.  No bruit.  EXTREMITIES:  No edema.  With 2+ peripheral pulses, and 2+ femoral pulses as well as dorsalis pedis and posterior tibial.  NEUROLOGIC:  Alert and oriented.  Grossly nonfocal.  LABORATORY DATA:  Electrocardiogram:  Normal sinus rhythm with no acute ischemic changes.  Chest x-ray:  No edema.  Hemoglobin 13.1, hematocrit 39.5, platelet count 237, white count 8.1.  INR 0.9.  Sodium 143, potassium 4.4, chloride 105, bicarbonate 29, BUN 15, creatinine 0.9.  CK and CK-MB 206 and 1.8, troponin  0.36.  ASSESSMENT AND PLAN: 1. Substernal chest pain:  The patients symptoms are reminiscent of his prior    presentation for acute coronary syndrome.  Despite nonspecific EKG changes,    there is mild elevation of his troponins.  The patient will be continued on    aspirin and beta blocker for tonight, as well as the addition of    intravenous heparin.  Due to the absence of pain, a 2B-3A inhibitor will    not be initiated. The patient has been scheduled for diagnostic    catheterization tomorrow.  The risks and benefits have been carefully    explained to the patient and his wife, and they are willing to proceed with    the procedure. 2. Hyperlipidemia:  Remains on Tricor and Zocor. 3. Disposition:  Cardiac catheterization tomorrow, as outlined above. Dictated by:   Lewayne Bunting, M.D. [facility] Attending Physician:  Talitha Givens DD:  10/29/01 TD:  10/29/01 Job: [id number]

## 2011-04-12 NOTE — Cardiovascular Report (Signed)
[place]. [facility]  Patient:    [name], [name] Visit Number: [id number] MRN: [id number]          Service Type: MED Location: [phone] Attending Physician:  Talitha Givens Dictated by:   Everardo Beals Juanda Chance, M.D. [facility] Proc. Date: 11/02/01 Admit Date:  10/29/2001   CC:         Malkiat Dhatt, M.D.  Barney Drain, M.D.  Cardiopulmonary Lab   Cardiac Catheterization  CLINICAL HISTORY:  Mr. Cothran is 65 years old and had and had rotational atherectomy and PTCA of the LAD in 1995 and had stenting of the LAD in January 2002.  He was admitted recently with recurrent chest pain, and arrangements were made for evaluation with angiography.  PROCEDURE: 1. Cardiac catheterization. 2. Percutaneous coronary intervention. 3. Temporary transvenous pacemaker. 4. Swan-Ganz placement.  CARDIOLOGISTEverardo Beals Juanda Chance, M.D. [facility]  PROCEDURE IN DETAIL:  The procedure was performed via the right femoral artery using a arterial sheath and 6-French diagnostic catheters.  A frontal arterial punch was performed, and Omnipaque contrast was used.  At the completion of diagnostic study, I made decision to proceed with intervention on the LAD.  The patient was given weight-adjusted heparin that prolonged the ACT to greater than 200 seconds and was given double bolus Integrilin after we crossed with the wire.  We used a JL4 7-French guiding catheter and were unable to cross with the Luge wire but were able to cross with a PT Graphics wire.  We did a distal injection with a 2.0 x 15 mm balloon to make sure we were intraluminal.  We then performed multiple inflations up and down the vessel with a 2.0 x 30 mm Maverick up to 6 atmospheres for 30 to 45 seconds. We then used a 2.5 x 15 mm cutting balloon to dilate within the stent and the proximal LAD and performed a total of five inflations up to 10 atmospheres for 48 seconds.  We then made the decision to perform  rotational atherectomy since there was diffuse distal disease which had not responded well to the balloon, and we thought the vessel was too small for a stent.  We used a 1.75 bur and performed a total of seven runs at approximately 165,000 rpm for 15 to 20 seconds.  This appeared to result in some disruption of vessel and flow down the distal vessel at the very distal part was not good, although we found no evidence of any perforation at that time.  We then removed the floppy wire and passed a PT Graphics down the vessel lumen.  We had difficulty crossing the distal part of the LAD, and the wire seemed to go into an area we were concerned was extravascular.  We then performed a balloon over the wire and did a distal injection through the balloon and demonstrated that we had indeed developed a wire perforation.  Repeat study through the guiding catheter showed there was a distal perforation, and we could not tell for sure if this communicated into the ventricular cavity or the pericardium.  Shortly after this, the patient became hypotensive with blood pressures down to 60 with a pulse in the range of 60.  We obtained an echocardiogram which showed a small and then enlarging pericardial effusion.  We passed a Swan-Ganz catheter, and the initial pressure in the right atrium was 8, and this rose to 21 over 15 to 20 minutes.  We had previously stopped the heparin and  Integrilin, and we passed a 2.0 balloon catheter into the LAD to occlude it to interrupt the distal leak.  With this, his blood pressure improved, and his situation stabilized.  We also reversed the heparin with protamine, giving 10 mg.  This decreased his ACT from 180 down to 110.  Dr. Ashley Mariner was consulted, and we made the decision to take the patient to the OR for evacuation of his pericardial effusion, ligation of any bleeding sites, and possible bypass of his LAD.  RESULTS:  Left main coronary artery was free of  significant disease.  Left anterior descending artery gave rise to two diagonal branches and two septal perforators and was completely occluded right at the distal edge of a stent.  The distal LAD filled faintly by collaterals from the right coronary artery.  The left circumflex artery gave rise to an intermediate branch and an A-V branch which terminated in a posterolateral branch.  This vessel was free of significant disease.  The right coronary artery was a moderate size vessel that gave rise to a right ventricular branch, a posterior descending branch, and three posterolateral branches.  This vessel was free of significant disease.  The left ventriculogram performed in the RAO projection showed good wall motion with no evidence of hypokinesis.   The estimated ejection fraction was 65%.  The right atrial pressure was initially 8 and rose to 24.  The pulmonary wedge pressure was initially 10 and rose to 24.  The aortic pressure was 127/74, and the left ventricular pressure was 127/4.  This fell to 60 at the time of the tamponade and improved to about 120 after the patient stabilized with the balloon occlusion.  CONCLUSION: 1. Coronary artery disease status post prior percutaneous coronary    interventions of the left anterior descending artery, with the last    intervention being stenting in January 2002, with total occlusion of the    left anterior descending artery at the distal edge of the stent after two    diagonal branches and two septal perforators, no major obstruction in the    circumflex and right coronary artery with normal left ventricular function. 2. Unsuccessful attempt at percutaneous coronary intervention due to wire    perforation of the distal left anterior descending artery resulting in    cardiac tamponade.  DISPOSITION:  The patient is being prepared to go to the OR for treatment of  his tamponade and possible bypass surgery by Dr. [name]. Dictated by:    Everardo Beals Juanda Chance, M.D. [facility] Attending Physician:  Talitha Givens DD:  11/02/01 TD:  11/02/01 Job: [id number]

## 2011-04-12 NOTE — Assessment & Plan Note (Signed)
[name]                            CARDIOLOGY OFFICE NOTE   [name], CUNY                    MRN:          [id number]  DATE:12/29/2006                            DOB:          21-Aug-1946    PRIMARY CARE PHYSICIAN:  Foye Deer, M.D.   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   In reviewing his records, it is apparent that [name] had several  percutaneous interventions on his LAD.  In 2002 he was undergoing a  percutaneous intervention for in-stent restenosis and developed a wire  perforation leading to tamponade.  He subsequently underwent emergent  one-vessel CABG with a LIMA to the LAD.  Since that time he has done  very well.  He denies any chest pain or shortness of breath.  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  He had a Myoview back in 2005 which showed an EF of 70%  with no ischemia.  He is previously followed by [name] but they are  no longer taking his insurance and now he is establishing long-term care  with [place].   REVIEW OF SYSTEMS:  Notable for heavy snoring and occasional apneic  episodes.  He denies any palpitations, no heart failure symptoms, no  claudication.  He does have muscle cramps sometimes at night which he  thinks may be related to his statin.   PAST MEDICAL HISTORY:  1. Coronary artery disease as described above.      a.     Status post LIMA to the LAD in 2002.      b.     Status post Myoview in October 2005 with EF of 70%, no       ischemia.  2. Hypertension.  3. Hyperlipidemia.  4. Obesity.  5. Status post right hip replacement.   CURRENT MEDICATIONS:  1. Enalapril 20 a  day.  2. Metoprolol 50 b.i.d.  3. Vytorin 10/40.  4. Prilosec 20.  5. Allegra 180.  6. Spironolactone/hydrochlorothiazide 25/25 one-half tablet daily.  7. Multivitamin.  8. Fish oil 1000 b.i.d.  9. Aspirin 81 a day.   SOCIAL HISTORY:  He lives in [place] with his wife.  He is a Ecologist.  He has four children.  A history of tobacco but quit in 1988.  Occasional alcohol.   FAMILY HISTORY:  Mother is alive at 50.  Father died at age 22 due to  heart failure.  Sister died at 21 due to cancer and another sister is  alive at 49.   PHYSICAL EXAMINATION:  GENERAL:  He is well-appearing, in no acute  distress.  Ambulates around the clinic without any respiratory  difficulty.  VITAL SIGNS:  Blood pressure is 116/74, heart rate is 71, weight is 226.  HEENT:  Sclerae anicteric, EOMI.  There  are fine xanthelasmas.  Oropharynx is clear, mucous membranes are moist.  NECK:  Supple, no JVD.  Carotids are 2+ bilaterally without any bruits.  There is no lymphadenopathy or thyromegaly.  CARDIAC:  He has a regular rate and rhythm.  No murmurs, rubs or  gallops.  LUNGS:  Clear.  ABDOMEN:  Obese, nontender, nondistended.  There is a ventral hernia.  No hepatosplenomegaly, no bruits, no masses.  EXTREMITIES:  Warm with no cyanosis, clubbing or edema.  Good distal  pulses, no rashes.  NEUROLOGIC:  Alert and oriented x3.  Cranial nerves II-XII are intact.  Moves all four extremities without difficulty.   EKG shows normal sinus rhythm at a rate of 71 with no ST-T wave  abnormalities.   ASSESSMENT AND PLAN:  1. Coronary artery disease.  This is quite stable without any evidence      of ischemia.  He is on an excellent medical regimen.  We had along      talk with the relative risks and benefits of Vytorin.  He is due      for a routine work treadmill test in the next few months and we      will go ahead and schedule this.  2. Hypertension, well controlled.  3. Hyperlipidemia.  Continue  Vytorin.  Will check a lipid panel next      week.  4. Heavy snoring and apneic episodes.  He obviously has sleep apnea.      We will send him for a sleep study to confirm and hopefully to get      CPAP therapy.  5. Obesity.  I have suggested that he engage in a regular walking      program 30-40 minutes four or five days a week in an effort to lose      weight.   DISPOSITION:  We will see him back in 6 months for routine followup.     Bevelyn Buckles. Bensimhon, MD  Electronically Signed    DRB/MedQ  DD: 12/29/2006  DT: 12/29/2006  Job #: [id number]   cc:   Foye Deer, M.D.

## 2011-04-12 NOTE — Cardiovascular Report (Signed)
[place]. [facility]  Patient:    [name], [name]                    MRN: [id number] Proc. Date: 12/08/00 Adm. Date:  [id number] Attending:  Lenoria Blackburn CC:         [name], M.D.  Cardiopulmonary Laboratory   Cardiac Catheterization  PROCEDURES PERFORMED:  Cardiac catheterization and stent implantation.  CLINICAL HISTORY:  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  He had rotational atherectomy of the proximal LAD lesion by Dr. Riley Blackburn and myself in 1995.  He had a routine followup Cardiolite scan recently which showed anterior ischemia.  He has had no chest pain.  Because of this scan he was brought in for evaluation with catheterization.  He indicates that the last scan that he had prior this was approximately two years prior.  DESCRIPTION OF PROCEDURE:  The procedure was performed via the right femoral artery using an arterial sheath and 6 French preformed coronary catheters.  A front wall arterial puncture was performed and Omnipaque contrast was used. After completion of the diagnostic study, we made a decision to proceed with intervention on the chronically totally occluded LAD.  We knew this would be a difficult intervention and I reviewed the case with Dr. Gerri Blackburn before we started.  The patient was consented and enrolled in the VICC trial and was randomized to either Isovue or Visipaque.  We used a 4.0, 7 [place] guiding catheter and tried multiple wires to cross.  It was very difficult to cross because there was a small nubbing but the wire selectively entered the diagonal branch or septal perforator.  We were finally able to get the ACS Cross-It wire to cross the lesion with the help of a 2.0 x 20 mm OpenSail for support.  We passed the balloon across the wire and did a distal injection to verify that we were in the lumen.  We then dilated multiple times with  a 2.0 vessel up and down the lesion.  We lost the wire on one occasion and had to recross with the PT Graphix.  We then stented the proximal segment with a 2.5 x 18 mm NIR performing one inflation performing 7 atmospheres for 20 seconds and then pulling the balloon back and performing a second inflation of 16 atmospheres for 30 seconds avoiding the distal edge.  We then used the same 2.0 balloon and dilated in a 90% lesion that was further down the vessel.  We performed several inflations up to 5 atmospheres for 30 seconds.  Repeat diagnostic studies were then performed through the guiding catheter.  The procedure was a very long procedure lasting greater than three hours.  The patient had quite a bit of back pain and received a large dose of Versed and morphine and some fentanyl.  Despite the long duration of the procedure, he otherwise tolerated the procedure well and left the laboratory in satisfactory condition.  RESULTS: The left main coronary artery:  The left main coronary artery was free of significant disease.  Left anterior descending: The left anterior descending artery is completely occluded after a large diagonal branch and a septal perforator.  There were faint collaterals from the left coronary system and there were also faint collaterals of the distal portion of the LAD from the right coronary system.  Circumflex artery: The circumflex artery gave rise to  an intermediate branch, a marginal branch and the posterolateral branch.  These vessels were free of significant disease.  Right coronary artery: The right coronary is a moderate sized vessel that gave rise to a conus branch, a posterior descending branch and two posterolateral branches.  There was also a right ventricular branch.  These vessels were free of significant disease.  LEFT VENTRICULOGRAPHY: The left ventriculogram performed in the RAO projection showed good wall motion with no areas of hypokinesis.   The estimated ejection fraction was 60%.  Following PTCA and stenting of the proximal stenosis, the stenosis improved from 100% to 10% and the flow improved from TIMI-0 to TIMI-3 flow. The distal vessel was somewhat diffusely diseased.  There was a 90% stenosis in the midportion that was treated with balloon angioplasty; this improved to 20%.  The LAD gave rise to two diagonal branches, one of which did not fill well of the native LAD.  CONCLUSIONS: 1. Coronary artery disease, status post prior rotational atherectomy    of the proximal left anterior descending in 1995 with now chronic    total occlusion of the proximal left anterior descending, no significant    lesions in the circumflex and right coronary artery and normal    LV function. 2. Successful percutaneous transluminal coronary angioplasty and stenting    of the proximal total occlusion of the left anterior descending with    improvement in percent diameter narrowing from 100% to 10%.  Successful    percutaneous transluminal coronary angioplasty of the mid left anterior    descending stenosis with improvement in percent diameter narrowing from    90% to 20%.  DISPOSITION:  The patient was returned to the postangioplasty unit for further observation. DD:  12/08/00 TD:  12/08/00 Job: [id number]

## 2011-04-12 NOTE — Op Note (Signed)
[place]  Patient:    [name], [name] Visit Number: [id number] MRN: [id number]          Service Type: MED Location: 2300 [id number] Attending Physician:  Talitha Givens Dictated by:   [name] [name], M.D. Proc. Date: 11/02/01 Admit Date:  10/29/2001   CC:         [name], M.D. [facility]  Lewayne Bunting, M.D. [facility]  Malkiat Dhatt, M.D.  Dr. Tomasa Blase in [place], [place]  CVTS office   Operative Report  PREOPERATIVE DIAGNOSIS:  Acute pericardial tamponade, status post coronary artery perforation during attempted percutaneous coronary intervention with one vessel coronary artery disease.  POSTOPERATIVE DIAGNOSIS:  Acute pericardial tamponade, status post coronary artery perforation during attempted percutaneous coronary intervention with one vessel coronary artery disease.  PROCEDURE:  Emergency median sternotomy for evacuation of pericardial tamponade, repair of perforated coronary artery, coronary artery bypass grafting x 1 (left internal mammary artery to distal left anterior descending coronary artery).  SURGEON:  [name]. [name], M.D.  ASSISTANT:  [name], M.D.  SECOND ASSISTANT:  [name], [name].  ANESTHESIA:  General.  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 Sherlyn Lick in [place].  The patient has a history of coronary artery disease and underwent percutaneous coronary atherectomy and stent placement in the proximal left anterior descending coronary artery in 1995.  In January of 2002, the patient returned with a positive stress test and underwent another percutaneous coronary intervention for chronically occluded left anterior descending coronary artery.  Most recently, the patient was admitted to the hospital on October 29, 2001, with new onset symptoms consistent with class 4 unstable angina.  Cardiac catheterization performed on the  morning of November 02, 2001, by Dr. Charlies Constable demonstrates occluded left anterior descending coronary artery with insignificant disease involving the left circumflex and right coronary system. Left ventricular function appeared preserved.  The patient subsequently underwent attempted percutaneous coronary intervention for the occluded left anterior descending coronary artery.  This procedure was complicated by acute perforation of the coronary artery, resulting in acute pericardial tamponade. A full consultation note has been dictated previously.  OPERATIVE CONSENT:  The patient and his wife have been briefly counseled regarding the emergent nature of this surgical procedure and the need for prompt surgical intervention.  All of there questions have been addressed. The patient and his wife both understand the possibility of multiple risk involved with surgical intervention including the possibility of death, stroke, myocardial infarction, bleeding requiring blood transfusion, arrhythmia, infection, and recurrent coronary artery disease.  All of their questions have been addressed.  DESCRIPTION OF PROCEDURE:  The patient was brought to the operating room on November 02, 2001, where he was placed in the supine position on the operating table.  General endotracheal anesthesia was induced uneventfully under the care and direction of Dr. Bedelia Person.  The patients chest, abdomen, both groins, and both lower extremities are prepared and draped in a sterile manner.  Intravenous antibiotics are administered.  Median sternotomy incision is performed.  The pericardial is opened, and subsequently a large amount of bright red blood was evacuated from the pericardial space.  Immediately, the patients hemodynamics improved considerably, and his blood pressure shot up 30 mmHg.  The dopamine infusion was subsequently discontinued.  Brief exploration is performed.  There is no sign of acute bleeding at  this time, although one can easily appreciate  a hematoma in the epicardial tissues along the distal portion of the left anterior descending coronary artery at the site of known perforation.  No other abnormalities are identified.  The left internal mammary artery was dissected from the chest wall and prepared for bypass grafting.  The left internal mammary artery was felt to be acceptable quality conduit.  It is slightly small caliber, but has good forward flow.  Simultaneously, an incision is made in the patients right lower leg to expose the greater saphenous vein.  The saphenous vein at this level is very small and felt not to be suitable conduit.  Subsequently, a second incision was made along the patients left lower leg.  This also was felt to be too small and not acceptable conduit.  During exploration, one can appreciate a small third diagonal branch off the left anterior descending coronary artery.  However, this vessel was felt to be too small and diffusely diseased to be suitable as a target for bypass grafting.  Therefore, no additional saphenous vein is procured.  The patient heparinized systemically.  The ascending aorta and the right atrium are cannulated for cardiopulmonary bypass.  Adequate heparinization is verified.  Cardiopulmonary bypass was begun and the surface of the heart is further inspected.  Repair at the site of perforation was performed using running 4-0 Prolene suture to oversew the left anterior descending coronary artery at this level.  The left internal mammary artery was then trimmed to appropriate length.  A cardioplegia catheter was placed in the ascending aorta.  The patients temperature was  allowed to drift to 32 degrees systemic temperature.  The aortic cross-clamp was applied, and cardioplegia is delivered in an antegrade fashion through the aortic root.  Ice saline slush was applied for topical hypothermia.  The initial cardioplegic arrest and  myocardial cooling are felt to be satisfactory.  Distal anastomosis is subsequently performed after opening the distal left anterior descending coronary artery, approximately 8 mm beyond the site of the perforation.  At this site, the vessel was of good quality, although small caliber, and measures 1.0 mm in diameter.  Distal anastomosis was performed between the distal left internal mammary artery and the left anterior descending coronary artery using running 8-0 Prolene suture.  The septal temperature was noted to rise appropriately following reperfusion of the left internal mammary artery.  The aortic cross-clamp was removed after a total cross-clamp time of 20 minutes.  The heart began to beat spontaneously and it subsequently defibrillated on one occasion.  Normal sinus rhythm resumed spontaneously.  The patient was rewarmed to greater than 37 degrees centigrade temperature.  Epicardial pacing wires are affixed to the right ventricular outflow tract and to the right atrial appendage.  The patient was weaned from cardiopulmonary bypass without difficulty.  The patients rhythm at separation from bypass was normal sinus rhythm.  No inotropic support is required.  Total cardiopulmonary bypass time for the operation is 31 minutes.  The venous and arterial cannulae are removed uneventfully.  Protamine was administered to reverse the anticoagulation.  The mediastinum and the left chest are irrigated with saline solution containing vancomycin.  Meticulous surgical hemostasis was ascertained.  The mediastinum and the left chest are drained with three chest tubes placed through separate stab incisions inferiorly.  The median sternotomy was closed in the routine fashion.  Both lower extremity incisions were closed in multiple layers in the routine fashion.  Subcuticular skin closures are utilized for all surgical incisions.  The patient tolerated the  procedure well and was transported to the  surgical intensive care unit in stable condition.  There were no intraoperative complications.  All sponge, instrument, and needle counts were verified correct at completion of the operation.  No blood products were administered. Dictated by:   [name] [name], M.D. Attending Physician:  Talitha Givens DD:  11/02/01 TD:  11/03/01 Job: [id number]

## 2011-04-12 NOTE — Discharge Summary (Signed)
NAMEJOSIAS, [name]                       ACCOUNT NO.:  [id number]   MEDICAL RECORD NO.:  [id number]                   PATIENT TYPE:  OBV   LOCATION:  2021                                 FACILITY:  [facility]   PHYSICIAN:  Salvadore Farber, M.D. [facility]         DATE OF BIRTH:  11-27-1945   DATE OF ADMISSION:  08/15/2002  DATE OF DISCHARGE:  08/16/2002                           DISCHARGE SUMMARY - REFERRING   PROCEDURES:  Stress Cardiolite.   HISTORY AND PHYSICAL:  The patient is a 65 year old male with a history of  coronary artery disease who came to the hospital on 08/15/2002 for atypical  chest pain.  Four days prior to admission, he began having left-sided chest  pain that was worsened with arm movement and was associated with  palpitations.  On the day of admission, he also noticed sharp left-sided  chest pain as well the previous aching sensation.  He came to the emergency  room and was admitted to rule out MI and for further evaluation.   HOSPITAL COURSE:  The next day, his enzymes were negative for MI, and he got  partial relief with Motrin 800 mg.  He had a gaited exercise treadmill  Cardiolite which showed no scar, no ischemia, and an EF that was normal.  The films were reviewed by the radiologist and by Dr. Dietrich Pates as well.  The patient was evaluated by Dr. Samule Ohm.  With the tenderness in his chest  wall as well as improvement with Motrin, it was felt that the most likely  cause for his pain was musculoskeletal.  He was considered stable for  discharge on 08/16/2002.   LABORATORY DATA:  D-dimer 0.46.  Serial CK-MB and troponin negative for MI.  Sodium 141, potassium 3.9, chloride 108, CO2 24, BUN 12, creatinine  0.8,glucose 88.  Hemoglobin 14.1, hematocrit 40.5, WBC 8.4, platelets 214.   Chest x-ray: No acute disease.   CONDITION ON DISCHARGE:  Improved.   DISCHARGE DIAGNOSES:  1. Left-sided chest pain consistent with musculoskeletal pain, D-dimer and  Cardiolite negative this admission.  2. Status post high-speed rotational atherectomy and percutaneous     transluminal coronary angioplasty to the left anterior descending artery     in 1995.  3. Status post stent to the left anterior descending artery in 2002.  4. Status post aortocoronary bypass surgery December 2002 with left internal     mammary artery to left anterior descending artery secondary to left     anterior descending artery perforation and cardiac tamponade.  5. Preserved left ventricular function.  6. Hyperlipidemia.  7. Gastroesophageal reflux disease symptoms.  8. Postoperative atrial fibrillation, resolved.  9. History of herpes zoster.  10.      Family history of premature coronary artery disease.  11.      Intolerance to CODEINE secondary to nausea.  12.      History of colon polyps.   DISCHARGE INSTRUCTIONS:  1. His activity level  is to be as tolerated, and he can return to work in     the morning.  2. He is to stick to a low-fat diet.  3. He is to follow up with Dr. Tomasa Blase and Dr. Sherlyn Lick and call for an     appointment.   DISCHARGE MEDICATIONS:  1. Protonix 40 mg q.d.  2. Lopressor 25 mg b.i.d.  3. Zocor 40 mg q.h.s.  4. Coated aspirin 81 mg q.d.  5. Garlic as taken prior to admission.  6. Ibuprofen 200 mg 2 tablets t.i.d. for 2 days, then p.r.n.       Lavella Hammock, P.A. [facility]                  Salvadore Farber, M.D. [facility]    RG/MEDQ  D:  08/16/2002  T:  08/18/2002  Job:  [id number]   cc:   Janace Litten, M.D.   Harl Bowie, M.D.  [address]  [place]  [place] [address]  Fax: [phone]

## 2011-04-12 NOTE — Discharge Summary (Signed)
[name], [name]             ACCOUNT NO.:  [id number]   MEDICAL RECORD NO.:  [id number]          PATIENT TYPE:  INP   LOCATION:  0469                         [facility]ACILITY[facility]   PHYSICIAN:  Georges Lynch. Gioffre, M.D.DATE OF BIRTH:  08/26/1946   DATE OF ADMISSION:  03/26/2005  DATE OF DISCHARGE:  03/30/2005                                 DISCHARGE SUMMARY   ADMISSION DIAGNOSES:  1. Degenerative arthritis, right hip.  2. Seasonal type allergies.  3. Hypertension.  4. High cholesterol.  5. Coronary artery disease.  6. Gastroesophageal reflux disease.     DISCHARGE DIAGNOSES:  1. Degenerative arthritis right hip, status post right total hip      arthroplasty.  2. Seasonal type allergies.  3. Hypertension.  4. High cholesterol.  5. Coronary artery disease.  6. Gastroesophageal reflux disease.     PROCEDURE:  The patient was taken to the operating room on Mar 26, 2005 to  undergo a right total hip arthroplasty.  Surgeon: Worthy Rancher M.D.  Assistant: Durene Romans, M.D.  The surgery was performed under general  anesthesia.   BRIEF HISTORY:  This is a 65 year old male who has had bilateral hip pain  for several years.  Symptoms have gotten much worse over the past few months  with right being worse than the left.  He has been on Mobic initially which  helped with his symptoms but that is no longer giving him relief.  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   Admission CBC: WBC 8.3, RBC 4.81, hemoglobin 14.9, hematocrit 43.2, platelet  count 226.  Admission PT 12.7, INR 1.0, PTT 27.  Admission chemistry: Sodium  141, potassium 4.3, chloride 108, CO2 27, glucose 96, BUN 11, creatinine  0.8, calcium 9.5.  Total protein 6.0, albumin 3.9.  Admission urinalysis is  normal.  The patient's blood type is O+, negative antibody screen.  Admission EKG: Normal  sinus rhythm at a rate of 62.  Preoperative x-ray of  right hip reveals degenerative changes, right hip.  Postoperative x-ray of  right hip revealed right total hip arthroplasty and good alignment.   HOSPITAL COURSE:  The patient was admitted to [facility].  He was  taken to the operating room.  He underwent the above stated procedure  without complications.  He tolerated the procedure well and was allowed to  return to the recovery room and then to the orthopedic floor to continue his  postoperative care.  The patient was given a PCA for pain control.  He was  doing very well by postoperative day #1 and he was weaned over to oral  analgesics and the PCA was discontinued on postoperative day #2.  PT was  consulted for gait training ambulation.  The patient was able to weightbear  with the aid of a walker and assistance by postoperative day #3.  The  patient's hemoglobin and hematocrit was followed throughout his  hospitalization.  He did have a  postoperative drop in his hemoglobin to 9.9  but it stabilized prior to discharge.  The patient was bothered by a burning  sensation in his right heel.  The skin was found to be intact and he was  made more comfortable by elevating his heels off of the bed which seemed to  resolve his problem.  The patient was having some difficulty sleeping while  in the hospital.  He was given a sleep aid that seemed to make him more  agitated.  Once the sleep agent was stopped he was able to rest more  comfortably and felt less agitated.  The patient was doing very well and was  allowed to be discharged home on postoperative day #4.  The patient was  discharged on Mar 30, 2005.   DISCHARGE MEDICATIONS:  1. Coumadin per pharmacy protocol.  2. Percocet 10/650 one or two every six hours as needed for pain.  3. Robaxin 500 mg one every six hours as needed for spasms.     SPECIAL INSTRUCTIONS:  Partial weightbearing with a walker.  Daily dressing   changes.  Keep the wound dry and clean.  The patient will call the office in  10 days to schedule a followup appointment.   CONDITION AT THE TIME OF DISCHARGE:  Stable.      LKP/MEDQ  D:  04/20/2005  T:  04/20/2005  Job:  [id number]

## 2011-04-12 NOTE — Procedures (Signed)
NAMELAMEL, MCCARLEY             ACCOUNT NO.:  [id number]   MEDICAL RECORD NO.:  [id number]          PATIENT TYPE:  OUT   LOCATION:  SLEEP CENTER                 [facility]ACILITY[facility]  [facility]   PHYSICIAN:  Coralyn Helling, MD        DATE OF BIRTH:  1946/06/02   DATE OF STUDY:  03/20/2007                            NOCTURNAL POLYSOMNOGRAM   REFERRING PHYSICIAN:   INDICATION FOR STUDY:  This is an individual who has a history of  hypertension, coronary artery disease and sleep disruption.  He is  referred to the sleep lab for evaluation of hypersomnia with obstructive  sleep apnea.   EPWORTH SLEEPINESS SCORE:  Zero.   MEDICATIONS:  Metoprolol, Protonix, Vytorin, enalapril, Aldactone,  hydrochlorothiazide, Mobic, Allegra, aspirin.   SLEEP ARCHITECTURE:  The patient followed a split night study protocol.  Total recording time was 468 minutes.  Total sleep time was 405 minutes.  Sleep efficiency was 83%.  The patient was observed in all stages of  sleep, but had a reduction percentage of slow wave sleep to 1% of the  study.  He had a significant increase in the percentage of his REM sleep  to 30% of the study, particularly notable during the therapeutic portion  of the test.  He slept in both the supine and non supine positions.   RESPIRATORY DATA:  The average respiratory rate was 16 during the  diagnostic portion of the study.  The apnea hypopnea index was found to  be 15.  There was one central event.  The remainder of the events were  obstructive in nature.  Loud snoring was noted by the technician.  He  also appeared to have a significant positional as well as REM effects  with sleep apnea.  During the therapeutic portion of the test, he was  titrated from a CPAP respiratory setting of 5 to 8 cm of water.  At a  CPAP pressure setting of 7 cm of water, his apnea hypopnea index reduced  to 0.  At this pressure, snoring was eliminated and he was observed in  both REM sleep and supine sleep.   OXYGEN DATA:  The baseline oxygenation was 94%.  The oxygen saturation  data was 81%.  At a CPAP pressure setting of 7 cm of water, the mean  oxygenation during non REM sleep was 92%, the mean oxygenation during  REM sleep was 92%, the minimal oxygenation during non REM sleep was 90%,  and the minimal oxygenation during REM sleep was 89%.   CARDIAC DATA:  The average heart rate was 65.  The rhythm strip showed  normal sinus rhythm.   MOVEMENT-PARASOMNIA:  The period limb movement index was 7.9.   IMPRESSIONS-RECOMMENDATIONS:  This study shows evidence for moderate  obstructive sleep apnea as demonstrated by an apnea hypopnea index of 15  and oxygenation nadir of 81%.  During the therapeutic portion of  the test, he was titrated to a CPAP pressure setting of 7 cm of water.  At this pressure setting, snoring was eliminated and he was observed in  both REM sleep and supine sleep.      Coralyn Helling, MD  Diplomat,  American Board of Sleep Medicine  Electronically Signed     VS/MEDQ  D:  03/26/2007 12:16:28  T:  03/26/2007 12:56:31  Job:  [id number]

## 2011-04-12 NOTE — Discharge Summary (Signed)
[name], [name]             ACCOUNT NO.:  [id number]   MEDICAL RECORD NO.:  [id number]          PATIENT TYPE:  INP   LOCATION:  2013                         FACILITY:  [facility]   PHYSICIAN:  Charlies Constable, M.D. [facility] DATE OF BIRTH:  12/26/1945   DATE OF ADMISSION:  09/05/2004  DATE OF DISCHARGE:  09/06/2004                                 DISCHARGE SUMMARY   PROCEDURES:  None.   HOSPITAL COURSE:  [name] is a 65 year old male with known coronary  artery disease.  He has had single-vessel coronary artery disease since  1995.  After the third intervention to his LAD, he had further chest pain  and came into the hospital.  He had a dissection after attempted  percutaneous intervention for a totalled LAD and required emergency bypass  surgery.  Since then, he has done well.  He has followed up with Dr. Sherlyn Lick  on a regular basis.   On the day prior to admission, [name] had an extreme level of exertion  when he was pushing a heavy load across a carpeted floor.  He had exertional  symptoms that lasted about 15 minutes and were relieved by rest.  On the day  of admission, he once again had symptoms, but these started with minimal  exertion while he was driving.  They lasted for several hours, and he came  to the emergency room, where his symptoms resolved once he was here.  He  stated that it felt like his anginal pain, but the location was different.  He states that pressing on his chest actually improved the symptoms.  He was  pain free at the time of exam.  He was admitted overnight for observation.   The next day, his enzymes were negative for MI.  His EKG had no acute  changes.  He had no further recurrence of his symptoms.  It was felt that he  could be discharged home and follow up with Dr. Sherlyn Lick as an outpatient with  a Cardiolite and an office visit arranged.   DISCHARGE CONDITION:  Improved.   DISCHARGE DIAGNOSES:  1.  Chest pain, enzymes negative for myocardial  infarction.  Cardiolite to      assess for ischemia.  2.  Status post aortocoronary bypass surgery in December 2002, with left      internal mammary artery to left anterior descending.  3.  Postoperative atrial fibrillation.  4.  Herpes zoster.  5.  Hypertension.  6.  Hyperlipidemia.  7.  Gastroesophageal reflux disease.  8.  Blood loss anemia after bypass.  9.  Osteoarthritis.   DISCHARGE INSTRUCTIONS:  1.  He is to limit his activity until after the stress test.  2.  He is to stick to a lowfat diet.  3.  He is to follow up with Dr. Sherlyn Lick on September 17, 2004 at 10:30, and he      is to have a stress test on September 13, 2004 at 8:45.   DISCHARGE MEDICATIONS:  1.  Lopressor 50 mg 1/2 tablet b.i.d. (additional 1/2 tablet p.r.n.      hypertension).  2.  Protonix 40 mg daily.  3.  Mobic 15 mg alternate 1 and 1/2 tablet.  4.  Vytorin 10/40 daily.  5.  Allegra 60 mg p.r.n.  6.  Vasotec 20 mg daily.       RB/MEDQ  D:  09/06/2004  T:  09/06/2004  Job:  [id number]   cc:   Harl Bowie, M.D.  [address]  [place]  [place] [address]  Fax: [phone]   Janace Litten, M.D.   Charlies Constable, M.D. [facility]

## 2011-04-12 NOTE — Assessment & Plan Note (Signed)
[facility]                             PULMONARY OFFICE NOTE   JABEN, BENEGAS                    MRN:          [id number]  DATE:02/09/2007                            DOB:          November 01, 1946    SLEEP CONSULTATION   I saw [name] with his wife today for evaluation of his sleep  difficulties.   He will typically go to bed around 10 p.m. and wake up at 7 a.m.  He is  described as a restless sleeper and oftentimes will kick his legs at  night.  He does snore.  His wife has seen him stop breathing  occasionally while he is asleep and he will actually wake up sometimes  hearing himself snore.  He tends to breathe more through his mouth and  will get cramps in his legs.  His sleep pattern is rather erratic due to  his work schedule.  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  The  other days he is at home and his sleep pattern is somewhat more regular.  He says that he usually does feel tired during the day and will  occasionally wake up with a headache in the morning, although he  attributes this more to his sinus problems.  He drinks two to three cups  of coffee in the morning and will occasionally have some more coffee in  the afternoon.  He says that he likes drinking coffee but this also  helps him to stay awake.  He has had instances in which he has felt  drowsy while driving, although he denies having any accidents while  driving, and he says that when this happens he will pull his truck over  and take a break until he feels refreshed.  He has also fallen asleep  while watching TV.  He is not currently using anything to help him fall  asleep at night.  He denies any symptoms of restless leg syndrome.  He  also denies any history of sleep hallucinations, sleep paralysis, or  cataplexy.  There is no history of nightmares, night terrors, sleep  walking, sleep  talking or bruxism.   PAST MEDICAL HISTORY:  Significant for hypertension.  He apparently had  myocardial perforation while undergoing angioplasty and as a result  underwent coronary artery bypass grafting in December 2002.  He does  have elevated cholesterol, chronic sinus problems.  He has also had a  hip replacement in May 2006.   CURRENT MEDICATIONS:  1. Enalapril 20 mg daily.  2. Lopressor 50 mg b.i.d.  3. Vytorin 10/40 mg daily.  4. Omeprazole 20 mg daily.  5. Allegra 180 mg daily.  6. Grape seed b.i.d.  7. Spironolactone/hydrochlorothiazide combination 25/25 one-half      tablet daily.  8. Centrum Silver once daily.  9. Fish oil 1000 mg b.i.d.  10.Aspirin 81 mg daily.  11.Mobic p.r.n.   He has allergies to CODEINE, AMBIEN, and NABUMETONE.   SOCIAL HISTORY:  He is married.  He works as a  truck driver.  He quit  smoking in 1987.  He used to smoke one-and-a-half packs of cigarettes  per day for 20 years.  He has only occasional alcohol use.   FAMILY HISTORY:  Significant for his father who had hypertension,  diabetes, and heart disease.  His mother had hypertension.  His sister  had cancer.  His son has hypertension.   REVIEW OF SYSTEMS:  Essentially negative except for stated above.   PHYSICAL EXAMINATION:  VITAL SIGNS:  He is 6 feet tall, 224 pounds.  Temperature is 97.9, blood pressure is 118/82, heart rate is 53, oxygen  saturation is 95% on room air.  HEENT:  Pupils reactive, extraocular muscles intact.  There is no sinus  tenderness.  He has a Mallampati 4 airway with scalloped borders of his  tongue and oropharyngeal crowding.  There is no lymphadenopathy.  HEART:  S1, S2.  CHEST:  There was no wheezing.  ABDOMEN:  Soft, nontender.  EXTREMITIES:  No edema.  NEUROLOGIC:  No focal deficits were appreciated.   IMPRESSION:  He certainly has symptoms as well as physical findings  which will be concerning for sleep-disordered breathing.   This is of particular  concern given the fact that he is on multiple  medications for his heart disease as well as his hypertension.  I  reviewed the adverse consequences of untreated sleep apnea including  coronary artery disease, hypertension, cerebrovascular disease,  diabetes, and irregular heartbeat.  I discussed with him the importance  of diet, exercise, and weight reduction.  Driving precautions were  discussed with him as well.  I also reviewed the possible implications  related to his employment with having the diagnosis of sleep apnea and  further discussions of this will be made after review of his sleep  study.  In the meantime, I have made arrangements for him to have an  overnight polysomnogram.  Then, depending upon the results of this,  further interventions would be recommended.     Coralyn Helling, MD  Electronically Signed    VS/MedQ  DD: 02/09/2007  DT: 02/09/2007  Job #: [id number]   cc:   Bevelyn Buckles. Bensimhon, MD

## 2011-04-12 NOTE — H&P (Signed)
NAMESAEED, [name]             ACCOUNT NO.:  [id number]   MEDICAL RECORD NO.:  [id number]          PATIENT TYPE:  INP   LOCATION:  2013                         FACILITY:  [facility]   PHYSICIAN:  Jesse Sans. Wall, M.D.   DATE OF BIRTH:  04/20/1946   DATE OF ADMISSION:  09/05/2004  DATE OF DISCHARGE:                                HISTORY & PHYSICAL   PRIMARY CARE PHYSICIAN:  Dr. Tomasa Blase in [place], [place].   PRIMARY CARDIOLOGIST:  Dr. Sherlyn Lick in [place], [place].   CHIEF COMPLAINT:  Chest pain.   HISTORY OF PRESENT ILLNESS:  [name] is a 65 year old male with a  history of bypass surgery in 2002.  He had exertional left chest pain on  September 04, 2004 at an exertion level that was very unusual for him.  It was  a 3 or 4/10 and it was relived in about 15 minutes with rest.  On the day of  admission he had onset of left-sided chest pain that was a tightness  associated with occasional sharp pains that started while he was driving.  His blood pressure was significantly elevated at 180/108 and so he took an  extra one-half Lopressor.  He had no associated symptoms.  He came to the ER  at approximately 1 p.m. and  his symptoms resolved with rest.  He has  occasional sharp pains but is otherwise okay.  He feels that these symptoms  are like his previous anginal pain but the location is different.  He states  that pressing on his chest with his hand actually improved his symptoms  somewhat.   PAST MEDICAL HISTORY:  Significant for:  1.  Bypass surgery in December 2002 with LIMA to LAD after dissection      secondary to attempted PCI.  2.  History of percutaneous intervention to the LAD in 1995 and in 2002.  3.  History of postoperative atrial fibrillation.  4.  History of herpes zoster.  5.  Hypertension.  6.  Hyperlipidemia.  7.  Gastroesophageal reflux disease.  8.  Blood loss anemia after surgery status post transfusion.  9.  Osteoarthritis in hip   SURGICAL  HISTORY:  1.  Catheterization.  2.  Bypass surgery.  3.  Colonoscopy with polypectomy.   ALLERGIES:  CODEINE and NABUMETONE.   MEDICATIONS:  1.  Lopressor 50 mg one-half tablet b.i.d..  2.  Protonix 40 mg daily.  3.  Mobic 15 mg alternating one tablet and one-half tablet.  4.  Vytorin 10/40 daily.  5.  Allegra 60 mg p.r.n.  6.  Vasotec 20 daily.   SOCIAL HISTORY:  He lives in [place] with his wife and he works as a Ecologist.  He has four sons.  He quit smoking in 1989 with approximately a 40  pack-year history.  He does not abuse alcohol or drugs.  He has not  exercised recently secondary to his arthritis.   FAMILY HISTORY:  His mother had a history of coronary artery disease.  His  father died after bypass surgery.  He has no siblings with coronary artery  disease.   REVIEW OF SYSTEMS:  He has some chronic dyspnea on exertion but this has not  changed recently.  The chest pain is described above.  He has chronic  arthralgias in his hip.  He has occasional reflux symptoms, especially  belching.  Review of systems is otherwise negative.   PHYSICAL EXAMINATION:  VITAL SIGNS:  Temperature is 99, blood pressure  140/81, pulse 68, respiratory rate 20, O2 saturation 98% on room air.  GENERAL:  He is a well-developed, well-nourished white male in no acute  distress.  HEENT:  His head is normocephalic and atraumatic.  Pupils equal, round, and  reactive to light and accommodation.  Extraocular movements intact.  Sclerae  clear.  Nares without discharge.  NECK:  There is no lymphadenopathy, thyromegaly, bruit, or JVD noted.  CARDIOVASCULAR:  His heart is regular in rate and rhythm with an S1, S2, and  no significant murmur, rub, or gallop is noted.  His distal pulses are 2+  and no femoral bruits are appreciated.  LUNGS:  Clear to auscultation bilaterally and there is no chest wall  tenderness.  SKIN:  No rashes or lesions are noted.  ABDOMEN:  Soft and nontender with active  bowel sounds and no  hepatosplenomegaly.  EXTREMITIES:  No cyanosis, clubbing, or edema.  MUSCULOSKELETAL:  There is no joint deformity or effusions and no spine or  CVA tenderness.  NEUROLOGIC:  He is alert and oriented x3.   EKG:  Sinus rhythm at 66 beats per minute with a small S wave in lead I, a  large Q wave in lead III, and minor T wave changes in lead III.  His point  of care markers are negative x3.   ASSESSMENT AND PLAN:  1.  Chest pain.  It is atypical.  He is slightly active at work but no      regular exercise.  We feel his risk of PE is extremely low.  His      symptoms started with a level of exertion that is extreme for him.  He      will be admitted to rule out MI.  If his enzymes are negative and his      symptoms resolve, we feel that an outpatient Cardiolite with Dr. Sherlyn Lick      is appropriate.  2.  Hypertension.  It is appropriate for him to take an extra half of      Lopressor as advised by Dr. Sherlyn Lick.  Will continue on his home      medications.  3.  Hyperlipidemia.  Will check liver functions with Vytorin having been      started recently.  4.  The patient is otherwise stable.  See PMH.   Dr. Juanito Doom saw the patient and determined the plan of care.       RB/MEDQ  D:  09/06/2004  T:  09/06/2004  Job:  [id number]   cc:   Harl Bowie, M.D.  [address]  [place]  [place] [address]  Fax: [phone]

## 2011-04-12 NOTE — H&P (Signed)
[facility]  Patient:    [name], [name] Visit Number: [id number] MRN: [id number]          Service Type: MED Location: 2000 2028 01 Attending Physician:  Learta Codding Dictated by:   Lewayne Bunting, M.D. [facility] Admit Date:  11/17/2001   CC:         Dr. Frederic Jericho, M.D.   History and Physical  DATE OF BIRTH:  [birth year]. REFERRING PHYSICIAN: Dr. Tomasa Blase. CARDIOLOGIST:  Harl Bowie, M.D., [place], [place].  CHIEF COMPLAINT:  Substernal chest pain and dizziness.  HISTORY OF PRESENT ILLNESS:  Mr. Wadding is a 65 year old white male with a history of known coronary artery disease.  The patient is status post recent cardiac catheterization on November 02, 2001, complicated by perforation after an attempted PTCA of a chronically occluded LAD lesion.  The patients ejection fraction at that time was 60%.  The patient presented with tamponade and required emergent coronary artery bypass grafting x 1 by Dr. [name], who was able to oversew the bleeding site.  Postoperatively the patient had atrial fibrillation but then spontaneously restored normal sinus rhythm.  He was also rather anemic and required several transfusions post procedure.  The patients postoperative ejection fraction is not known.  He has now been admitted through the emergency room after several episodes of lightheadedness and dizziness which started after 10 oclock this morning. The patient felt some tingling in both upper extremities with worsening symptoms when trying to get up and walking around, with a feeling of presyncope but without frank syncope.  Later that afternoon the patient started complaining of sharp pains in the chest, clearly worsening with deep inspiration.  He denies, however, any shortness of breath on exertion.  He also has had complaints of abdominal bloating and has had poor oral intake, not able to tolerate intake of solids.  He  reports in addition frequent belching.  Approximately a week ago he also called our on-call physician because of symptoms of palpitations.  He does not report currently palpitations.  On admission to the emergency room, the patient is pain-free with stable vital signs and no acute ischemic change on his electrocardiogram.  ALLERGIES:  CODEINE.  MEDICATIONS:  Aspirin 325 mg p.o. q.d., Zantac 150 mg p.o. b.i.d., Zocor 80 mg p.o. q.h.s., Tricor 54 mg p.o. b.i.d., Niferex 150 mg p.o. b.i.d., Lopressor 25 mg p.o. b.i.d.  PAST MEDICAL HISTORY: 1. Status post prior catheterization January 2002, with PCA and stent to the    LAD, with more recent cardiac catheterization with details as outlined    above with perforation of the LAD post atherectomy. 2. Status post coronary artery bypass grafting x 1 performed by Dr. [name] after    pericardial effusion secondary to perforation. 3. Postoperative atrial fibrillation. 4. Hypertension. 5. Hyperlipidemia. 6. Remote tobacco abuse. 7. History of polypectomy. 8. GERD.  SOCIAL HISTORY:  The patient lives in [place] with his wife and four sons. He is a Naval architect.  He quit tobacco 14 years ago and rarely drinks alcohol.  FAMILY HISTORY:  Notable for mother who is alive but has heart problems. Father died of coronary artery disease after he underwent CABG x 5.  REVIEW OF SYSTEMS:  Reports weight loss but no fevers or chills.  No headache. No skin lesions or rashes.  Chest pain as outlined above, but no shortness of breath, dyspnea on exertion, orthopnea, or PND.  Positive for palpitations. No frequency or dysuria.  Reports weakness and numbness as well as depression. The patient also reports anxiety and difficulty sleeping.  He reports no myalgias, arthralgias, no nausea or vomiting, but frequent belching and GERD symptoms.  No polyuria or polydipsia.  The remainder of systems are negative.  PHYSICAL EXAMINATION:  VITAL SIGNS:  Blood pressure  112/71, heart rate is 78 beats per minute, respirations are 18 per minute, saturation 98% on room air.  GENERAL:  A well-nourished white male in no apparent distress.  HEENT:  NCAT, PERRLA, EOMI.  Sclerae clear.  Oropharynx without erythema or exudate.  NECK:  Supple.  No bruit or JVD.  CHEST:  Clear breath sounds bilaterally.  CARDIAC:  Regular rate and rhythm, normal S1, S2.  No murmur, rub, or gallop. Pulses are equal bilaterally.  Frequent ectopic beats.  SKIN:  No rash or lesion.  Well-healed incision.  There is no click on palpation of the sternum.  ABDOMEN:  Soft, nontender.  Hyperactive bowel sounds but no hepatosplenomegaly.  GENITOURINARY/RECTAL:  Deferred.  EXTREMITIES:  No cyanosis, clubbing, or edema.  Lower extremity incisions are healing well.  MUSCULOSKELETAL:  No joint deformity.  NEUROLOGIC:  The patient is alert and oriented and grossly nonfocal.  LABORATORY DATA:  Chest x-ray:  Small left effusion with bibasilar atelectasis.  EKG:  Heart rate 83 beats per minute, normal sinus rhythm, normal axis.  PR, QRS, and QTC interval are within normal limits.  Nonspecific T-wave changes and occasional PVCs.  Hemoglobin 13, hematocrit 39.  Sodium 140, potassium 4.2, chloride 108, bicarbonate 28, BUN 13, creatinine 0.8.  Troponin is 0.03.  Magnesium is 2.2.  IMPRESSION AND PLAN: 1. Dizziness/weakness.  The patient appears to be slightly dehydrated.  He has    had poor oral intake due to frequent belching and bloating.  The patient    will be given intravenous fluids overnight.  He has been also ruled out for    a significant pericardial effusion given his prior history of pericardial    tamponade.  A CT scan does not show evidence of a significant effusion.    There is a small pericardial effusion.  There is no evidence of pulmonary    emboli by spiral CT scan protocol.  The patient does report occasional     palpitations.  it is possible that he could have  arrhythmias, particularly    atrial fibrillation, contributing to symptoms.  He will be monitored    overnight and may require an event monitor as an outpatient. 2. Rule out arrhythmias as outlined above.  Will monitor and possibly event    monitor as an outpatient. 3. Anxiety/depression.  The patient has had mood disturbances since his bypass    surgery.  He has been rather anxious and has had difficulty sleeping.  He    will be given Ambien 5 mg p.o. q.h.s. 4. Hyperlipemia.  The patient is on high doses of lipid-lowering drugs.  It is    possible that they are contributing to his symptoms of gastrointestinal    distress, and may consider lowering the doses. 5. Rule out myocardial infarction/coronary artery disease.  The patient will    be ruled out with serial troponins.  If the BNP and C-reactive protein as    well as troponins are within normal limits and there are no significant    arrhythmias overnight, do anticipate that the patient could probably be    discharged to home without patient follow-up with an event monitor.Dictated y:   Michelle Piper  Andee Lineman, M.D. [facility] Attending Physician:  Learta Codding DD:  11/17/01 TD:  11/19/01 Job: [id number]

## 2011-04-12 NOTE — Op Note (Signed)
[name], [name]             ACCOUNT NO.:  [id number]   MEDICAL RECORD NO.:  [id number]          PATIENT TYPE:  INP   LOCATION:  0469                         FACILITY:  [facility]   PHYSICIAN:  Georges Lynch. Gioffre, M.D.DATE OF BIRTH:  20-Mar-1946   DATE OF PROCEDURE:  03/26/2005  DATE OF DISCHARGE:                                 OPERATIVE REPORT   SURGEON:  Georges Lynch. Darrelyn Hillock, M.D.   ASSISTANT:  Madlyn Frankel. Charlann Boxer, M.D.   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   POSTOPERATIVE DIAGNOSIS:  Severe degenerative arthritis of the right hip  with about 1/4 to 1/2 inch shortening on the right, as compared to the left.   OPERATION:  Right total hip arthroplasty utilizing the DePuy system.  Utilized the metal-on-metal DePuy system.  The cup size was a 58 mm cup.  The Uni femoral ball or implant was a size 51 mm in diameter.  The hip stem  was a 17.5 lateral off-set stem.  The tapered sleeve was a +8.   PROCEDURE:  Under general anesthesia, routine orthopedic prepping and  draping of the right hip was carried out.  The patient had 1 gm of IV Ancef.  A posterolateral approach to the right hip was carried out.  Bleeders were  identified and cauterized.  The self-retaining retractors were inserted.  I  then opened the iliotibial band by sharp dissection and then by blunt  dissection, separated the gluteal muscles.  Bleeders identified and  cauterized.  At all times, we protected the sciatic nerve.  We then  internally rotated the hip, partially detached the external rotators.  We  did not detach the Zickel's band.  At this time, I then went down,  identified the capsule.  Did a capsulectomy.  Dislocated the head.  Amputated the femoral head at the appropriate neck length.  We then rasped  the femoral shaft up to a size 17.5 mm.  Following this, we reamed the  acetabulum up to a size 57 mm.   We then inserted our permanent metal cup,  measuring 58 mm in diameter.  Once this was done, we did go through trials  in the usual fashion.  We finally selected a 51 mm ball, a +8 stem, and a  size 17.5 femoral component, lateral off-set.  Once these were inserted, we  went through trials again and then finally removed a trial 51 mm ball and  inserted the permanent 51 mm ball.  We then reduced the hip after we made  sure the acetabulum was clear, and we took the hip through motion and had  excellent stability.  I then reattached the piriformis in the usual fashion  and then closed the remaining part of the wound in the usual fashion.  The  skin was closed with metal staples, and a sterile Neosporin dressing was  applied.  The patient had 1 gm of IV Ancef preop.      RAG/MEDQ  D:  03/26/2005  T:  03/26/2005  Job:  [id number]

## 2011-04-29 ENCOUNTER — Other Ambulatory Visit (INDEPENDENT_AMBULATORY_CARE_PROVIDER_SITE_OTHER): Payer: 59 | Admitting: *Deleted

## 2011-04-29 DIAGNOSIS — E7800 Pure hypercholesterolemia, unspecified: Secondary | ICD-10-CM

## 2011-04-29 DIAGNOSIS — Z79899 Other long term (current) drug therapy: Secondary | ICD-10-CM

## 2011-04-29 LAB — HEPATIC FUNCTION PANEL
AST: 41 U/L — ABNORMAL HIGH (ref 0–37)
Alkaline Phosphatase: 54 U/L (ref 39–117)
Total Bilirubin: 0.7 mg/dL (ref 0.3–1.2)

## 2011-04-29 LAB — LIPID PANEL: Total CHOL/HDL Ratio: 4

## 2011-05-06 ENCOUNTER — Ambulatory Visit (INDEPENDENT_AMBULATORY_CARE_PROVIDER_SITE_OTHER): Payer: 59

## 2011-05-06 VITALS — Wt 218.0 lb

## 2011-05-06 DIAGNOSIS — E785 Hyperlipidemia, unspecified: Secondary | ICD-10-CM

## 2011-05-06 MED ORDER — ROSUVASTATIN CALCIUM 10 MG PO TABS: ORAL_TABLET | ORAL | Status: DC

## 2011-05-06 NOTE — Patient Instructions (Addendum)
Continue Crestor 10mg  on Tuesday and Friday, Zetia once daily and fish oil 6gm daily  Try to get back to exercising as much as possible once your orthopedic surgeon releases you.  Continue your low-fat, low-cholesterol diet.  Enjoy your fresh vegetables from the garden this summer.   Follow-up with Dr. Gala Romney in August.

## 2011-05-06 NOTE — Progress Notes (Signed)
HPI           [name] presents to lipid clinic today for a follow up visit. He recently had a L hip replaceemtn on April 20th.  He is recovering well and walking with only the help of a cane.  At his previous visit he was complaining of muscle cramps with Crestor 3 days per week.  At that time, Crestor was decreased to only 2 days/week.  Since then, he has not noticed any muscle cramps related to his Crestor.  He is compliant with all of his other medications.      The patient states that he has not been able to exercise due to his recent surgery.  He has been out of work since March but has appt with his orthopedic surgeon in July and hopes to be cleared to return to work.  He has tried to do a few things around the house but is limited to the amount of weight he can pick up.  Despite this, his weight is relatively unchanged at 218lb (215lb in Feb.)       Pt continues to eat only 2 meals/day.  For breakfast he eats a sausage and egg biscuit.  Lunch and dinner is whatever he has around.  He likes to get salads from [employer].  He does have a garden at his home and is enjoying fresh vegetables.  He likes to snack on fruits such as apples and pineapples throughout the day.  He tries to limit fried foods.        The patient is not currently a smoker. He was a previous smoker and quit 25 years ago.  Current Outpatient Prescriptions  Medication Sig Dispense Refill  . acetaminophen (TYLENOL ARTHRITIS PAIN) 650 MG CR tablet Take 1,300 mg by mouth daily.        Marland Kitchen aspirin 81 MG tablet Take 81 mg by mouth daily.        . enalapril (VASOTEC) 20 MG tablet Take 10 mg by mouth every morning. And 20 mg in the evening       . ezetimibe (ZETIA) 10 MG tablet Take 10 mg by mouth daily.        . fexofenadine (ALLEGRA) 180 MG tablet Take 180 mg by mouth daily as needed.        . fish oil-omega-3 fatty acids 1000 MG capsule Take 3 g by mouth 2 (two) times daily.        . meloxicam (MOBIC) 15 MG tablet Take 15 mg by  mouth daily as needed.        . metoprolol (LOPRESSOR) 50 MG tablet Take 25 mg by mouth 2 (two) times daily.        . rosuvastatin (CRESTOR) 10 MG tablet Take 10 mg by mouth. On Tuesday and Friday       . spironolactone-hydrochlorothiazide (ALDACTAZIDE) 25-25 MG per tablet Take 0.5 tablets by mouth daily.

## 2011-05-06 NOTE — Assessment & Plan Note (Signed)
Pt's cholesterol slightly elevated since last visit but Crestor was decreased to only 2 days per week and pt's exercise has been down due to recent surgery.  Will continue same medications for now.  Pt tolerating with no issues.  AST and ALT are slightly elevated but remaining stable.  Will just continue to watch.  Encouraged pt to start to exercise as soon as possible.  He would like to f/u with Dr. Gala Romney in future to help decrease number of MD visits.  He is scheduled to see him in August.

## 2011-05-13 ENCOUNTER — Ambulatory Visit: Payer: 59

## 2011-06-19 ENCOUNTER — Encounter: Payer: Self-pay | Admitting: Internal Medicine

## 2011-07-19 ENCOUNTER — Ambulatory Visit: Payer: 59 | Admitting: Internal Medicine

## 2011-07-26 ENCOUNTER — Ambulatory Visit (INDEPENDENT_AMBULATORY_CARE_PROVIDER_SITE_OTHER): Payer: 59 | Admitting: Internal Medicine

## 2011-07-26 ENCOUNTER — Encounter: Payer: Self-pay | Admitting: Internal Medicine

## 2011-07-26 DIAGNOSIS — E781 Pure hyperglyceridemia: Secondary | ICD-10-CM

## 2011-07-26 DIAGNOSIS — E785 Hyperlipidemia, unspecified: Secondary | ICD-10-CM

## 2011-07-26 DIAGNOSIS — I2510 Atherosclerotic heart disease of native coronary artery without angina pectoris: Secondary | ICD-10-CM

## 2011-07-26 MED ORDER — ROSUVASTATIN CALCIUM 10 MG PO TABS: 10.0000 mg | ORAL_TABLET | Freq: Every day | ORAL | Status: DC

## 2011-07-26 NOTE — Assessment & Plan Note (Signed)
Lipids look good. Continue current regimen.

## 2011-07-26 NOTE — Assessment & Plan Note (Signed)
No evidence of ischemia. Continue current regimen. Stress test looks good.

## 2011-07-26 NOTE — Patient Instructions (Signed)
Your physician wants you to follow-up in: 24 months.   You will receive a reminder letter in the mail two months in advance. If you don't receive a letter, please call our office to schedule the follow-up appointment.  Your physician has recommended you make the following change in your medication:  Take your Crestor 10mg  on Tuesdays and Fridays

## 2011-07-26 NOTE — Progress Notes (Signed)
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  He returns, today, for routine followup.Past medical history is also notable for obesity and obstructive sleep apnea with intolerance to CPAP . Echo 8/11 EF 65%. Myoview 3/12 EF 71% normal perfusion   From cardiac standpoint doing very well. No CP/SOB. No palpitations. Continues with indigestion. Did well with rehab after his hip replacement. Taking Crestor 2x/week. No longer following with lipid clinic as lipids well controlled. Last LDL 81 HDL 42  Notes that he has very easy bruising.   ROS: All systems negative except as listed in HPI, PMH and Problem List.  Past Medical History  Diagnosis Date  . CAD (coronary artery disease)     s/p CABG  . HTN (hypertension)   . Hyperlipidemia     intolerant of all statins due to muscle aches  . Prostate cancer     s/p resection  . OSA (obstructive sleep apnea)     resolved with weight loss  . S/P colonoscopy with polypectomy     Current Outpatient Prescriptions  Medication Sig Dispense Refill  . acetaminophen (TYLENOL ARTHRITIS PAIN) 650 MG CR tablet Take 1,300 mg by mouth daily.        Marland Kitchen aspirin 81 MG tablet Take 81 mg by mouth daily.        . enalapril (VASOTEC) 20 MG tablet Take 10 mg by mouth every morning. And 20 mg in the evening       . ezetimibe (ZETIA) 10 MG tablet Take 10 mg by mouth daily.        . fexofenadine (ALLEGRA) 180 MG tablet Take 180 mg by mouth daily as needed.        . fish oil-omega-3 fatty acids 1000 MG capsule Take 3 g by mouth 2 (two) times daily.        . meloxicam (MOBIC) 15 MG tablet Take 15 mg by mouth daily as needed.        . metoprolol (LOPRESSOR) 50 MG tablet Take 25 mg by mouth 2 (two) times daily.        . rosuvastatin (CRESTOR) 10  MG tablet Take 1 tablet by mouth on Tuesday and Friday  10 tablet  6  . spironolactone-hydrochlorothiazide (ALDACTAZIDE) 25-25 MG per tablet Take 0.5 tablets by mouth daily.           PHYSICAL EXAM: Filed Vitals:   07/26/11 0943  BP: 122/82  Pulse: 65   General:  Well appearing. No resp difficulty HEENT: normal Neck: supple. JVP flat. Carotids 2+ bilaterally; no bruits. No lymphadenopathy or thryomegaly appreciated. Cor: PMI normal. Regular rate & rhythm. No rubs, gallops or murmurs. Lungs: clear Abdomen: soft, nontender, nondistended. No hepatosplenomegaly. No bruits or masses. Good bowel sounds. Extremities: no cyanosis, clubbing, rash, edema Neuro: alert & orientedx3, cranial nerves grossly intact. Moves all 4 extremities w/o difficulty. Affect pleasant.    ECG: NSR 65 No ST-T wave abnormalities.    ASSESSMENT & PLAN:

## 2011-08-05 ENCOUNTER — Telehealth: Payer: Self-pay | Admitting: Internal Medicine

## 2011-08-05 DIAGNOSIS — E781 Pure hyperglyceridemia: Secondary | ICD-10-CM

## 2011-08-05 NOTE — Telephone Encounter (Signed)
crestor 10 mg was to be called in on  last visit, not there, pls call to  [facility] n [address]

## 2011-08-06 MED ORDER — ROSUVASTATIN CALCIUM 10 MG PO TABS: 10.0000 mg | ORAL_TABLET | Freq: Every day | ORAL | Status: DC

## 2011-08-07 ENCOUNTER — Telehealth: Payer: Self-pay | Admitting: *Deleted

## 2011-08-07 NOTE — Telephone Encounter (Signed)
Per Pam with [facility], they need our office to contact the patient's insurance at [phone] for Prior Authorization for Crestor. I will forward to Debbie.

## 2011-08-08 NOTE — Telephone Encounter (Signed)
Approval given for 24 months.  [facility] notified.

## 2011-11-24 ENCOUNTER — Other Ambulatory Visit: Payer: Self-pay | Admitting: Internal Medicine

## 2011-11-25 ENCOUNTER — Other Ambulatory Visit: Payer: Self-pay

## 2011-11-25 MED ORDER — METOPROLOL TARTRATE 50 MG PO TABS: 25.0000 mg | ORAL_TABLET | Freq: Two times a day (BID) | ORAL | Status: DC

## 2011-12-17 ENCOUNTER — Telehealth: Payer: Self-pay

## 2011-12-17 NOTE — Telephone Encounter (Signed)
Yes, ok to stop aspirin for a few days if they require it.

## 2011-12-17 NOTE — Telephone Encounter (Signed)
[name] is scheduled for a colonoscopy on 12/31/11.  Is it ok for him to stop his aspirin starting on 12/26/11?

## 2012-01-06 ENCOUNTER — Other Ambulatory Visit (HOSPITAL_COMMUNITY): Payer: Self-pay | Admitting: Internal Medicine

## 2012-01-06 NOTE — Telephone Encounter (Signed)
Refilled enalapril and generic aldactazide

## 2012-07-13 ENCOUNTER — Telehealth: Payer: Self-pay | Admitting: Internal Medicine

## 2012-07-13 NOTE — Telephone Encounter (Signed)
New Problem:    Patient called in needing a letter of clearance from Dr. 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  Please call back.

## 2012-07-13 NOTE — Telephone Encounter (Signed)
Per Dr Gala Romney, this is ok he will write a letter

## 2012-07-16 ENCOUNTER — Encounter (HOSPITAL_COMMUNITY): Payer: Self-pay | Admitting: Internal Medicine

## 2012-07-16 NOTE — Telephone Encounter (Signed)
Left mess letter completed and faxed

## 2012-12-25 ENCOUNTER — Other Ambulatory Visit: Payer: Self-pay

## 2012-12-25 MED ORDER — METOPROLOL TARTRATE 50 MG PO TABS: 25.0000 mg | ORAL_TABLET | Freq: Two times a day (BID) | ORAL | Status: DC

## 2013-02-02 ENCOUNTER — Other Ambulatory Visit (HOSPITAL_COMMUNITY): Payer: Self-pay | Admitting: *Deleted

## 2013-02-02 MED ORDER — ROSUVASTATIN CALCIUM 10 MG PO TABS: 10.0000 mg | ORAL_TABLET | Freq: Every day | ORAL | Status: DC

## 2013-03-23 ENCOUNTER — Other Ambulatory Visit (HOSPITAL_COMMUNITY): Payer: Self-pay | Admitting: *Deleted

## 2013-03-23 MED ORDER — SPIRONOLACTONE-HCTZ 25-25 MG PO TABS: 0.5000 | ORAL_TABLET | Freq: Every day | ORAL | Status: DC

## 2013-03-23 MED ORDER — ENALAPRIL MALEATE 20 MG PO TABS: ORAL_TABLET | ORAL | Status: DC

## 2013-04-12 ENCOUNTER — Other Ambulatory Visit: Payer: Self-pay | Admitting: Internal Medicine

## 2013-04-13 ENCOUNTER — Other Ambulatory Visit: Payer: Self-pay | Admitting: Internal Medicine

## 2013-04-26 ENCOUNTER — Encounter (HOSPITAL_COMMUNITY): Payer: Self-pay | Admitting: Emergency Medicine

## 2013-04-26 ENCOUNTER — Emergency Department (HOSPITAL_COMMUNITY): Payer: 59

## 2013-04-26 ENCOUNTER — Observation Stay (HOSPITAL_COMMUNITY)
Admission: EM | Admit: 2013-04-26 | Discharge: 2013-04-27 | Disposition: A | Discharge status: Home / Self Care | Payer: 59 | Attending: Cardiovascular Disease | Admitting: Cardiovascular Disease

## 2013-04-26 DIAGNOSIS — E785 Hyperlipidemia, unspecified: Secondary | ICD-10-CM | POA: Diagnosis present

## 2013-04-26 DIAGNOSIS — R079 Chest pain, unspecified: Secondary | ICD-10-CM

## 2013-04-26 DIAGNOSIS — I10 Essential (primary) hypertension: Secondary | ICD-10-CM | POA: Insufficient documentation

## 2013-04-26 DIAGNOSIS — I200 Unstable angina: Secondary | ICD-10-CM | POA: Insufficient documentation

## 2013-04-26 DIAGNOSIS — E782 Mixed hyperlipidemia: Secondary | ICD-10-CM | POA: Insufficient documentation

## 2013-04-26 DIAGNOSIS — I498 Other specified cardiac arrhythmias: Secondary | ICD-10-CM | POA: Diagnosis present

## 2013-04-26 DIAGNOSIS — I2510 Atherosclerotic heart disease of native coronary artery without angina pectoris: Principal | ICD-10-CM | POA: Insufficient documentation

## 2013-04-26 HISTORY — DX: Gastro-esophageal reflux disease without esophagitis: K21.9

## 2013-04-26 LAB — COMPREHENSIVE METABOLIC PANEL
ALT: 81 U/L — ABNORMAL HIGH (ref 0–53)
BUN: 16 mg/dL (ref 6–23)
CO2: 25 mEq/L (ref 19–32)
Calcium: 9.5 mg/dL (ref 8.4–10.5)
Creatinine, Ser: 0.74 mg/dL (ref 0.50–1.35)
GFR calc Af Amer: >90 mL/min (ref >90)
GFR calc non Af Amer: >90 mL/min (ref >90)
Glucose, Bld: 98 mg/dL (ref 70–99)

## 2013-04-26 LAB — CBC
Hemoglobin: 15 g/dL (ref 13.0–17.0)
MCHC: 34.3 g/dL (ref 30.0–36.0)
RBC: 4.74 MIL/uL (ref 4.22–5.81)
WBC: 10 10*3/uL (ref 4.0–10.5)

## 2013-04-26 LAB — POCT I-STAT TROPONIN I: Troponin i, poc: 0 ng/mL (ref 0.00–0.08)

## 2013-04-26 MED ORDER — ASPIRIN 81 MG PO TABS: 81.0000 mg | ORAL_TABLET | Freq: Every day | ORAL | Status: DC

## 2013-04-26 MED ORDER — PANTOPRAZOLE SODIUM 40 MG PO TBEC
40.0000 mg | DELAYED_RELEASE_TABLET | Freq: Every day | ORAL | Status: DC
  Administered 2013-04-27: 40 mg via ORAL
  Filled 2013-04-26: qty 1

## 2013-04-26 MED ORDER — MORPHINE SULFATE 2 MG/ML IJ SOLN: 1.0000 mg | INTRAMUSCULAR | Status: DC | PRN

## 2013-04-26 MED ORDER — ENALAPRIL MALEATE 10 MG PO TABS
10.0000 mg | ORAL_TABLET | Freq: Every day | ORAL | Status: DC
  Administered 2013-04-27: 10 mg via ORAL
  Filled 2013-04-26: qty 1

## 2013-04-26 MED ORDER — NON FORMULARY: 10.0000 mg | Freq: Once | Status: DC

## 2013-04-26 MED ORDER — ASPIRIN 81 MG PO CHEW
324.0000 mg | CHEWABLE_TABLET | Freq: Once | ORAL | Status: AC
  Administered 2013-04-26: 324 mg via ORAL
  Filled 2013-04-26: qty 4

## 2013-04-26 MED ORDER — ENALAPRIL MALEATE 10 MG PO TABS: 10.0000 mg | ORAL_TABLET | Freq: Two times a day (BID) | ORAL | Status: DC

## 2013-04-26 MED ORDER — MORPHINE SULFATE 4 MG/ML IJ SOLN
4.0000 mg | INTRAMUSCULAR | Status: DC | PRN
  Administered 2013-04-26: 4 mg via INTRAVENOUS
  Filled 2013-04-26: qty 1

## 2013-04-26 MED ORDER — NITROGLYCERIN 0.4 MG SL SUBL
0.4000 mg | SUBLINGUAL_TABLET | SUBLINGUAL | Status: DC | PRN
  Administered 2013-04-26: 0.4 mg via SUBLINGUAL
  Filled 2013-04-26: qty 25

## 2013-04-26 MED ORDER — METOPROLOL TARTRATE 25 MG PO TABS
25.0000 mg | ORAL_TABLET | Freq: Two times a day (BID) | ORAL | Status: DC
  Administered 2013-04-26 – 2013-04-27 (×2): 25 mg via ORAL
  Filled 2013-04-26 (×3): qty 1

## 2013-04-26 MED ORDER — ACETAMINOPHEN 325 MG PO TABS
650.0000 mg | ORAL_TABLET | ORAL | Status: DC | PRN
  Administered 2013-04-26 – 2013-04-27 (×2): 650 mg via ORAL
  Filled 2013-04-26 (×2): qty 2

## 2013-04-26 MED ORDER — MELOXICAM 15 MG PO TABS
15.0000 mg | ORAL_TABLET | Freq: Every day | ORAL | Status: DC | PRN
  Filled 2013-04-26: qty 1

## 2013-04-26 MED ORDER — ROSUVASTATIN CALCIUM 10 MG PO TABS
10.0000 mg | ORAL_TABLET | ORAL | Status: DC
  Filled 2013-04-26: qty 1

## 2013-04-26 MED ORDER — ONDANSETRON HCL 4 MG/2ML IJ SOLN: 4.0000 mg | Freq: Four times a day (QID) | INTRAMUSCULAR | Status: DC | PRN

## 2013-04-26 MED ORDER — ASPIRIN EC 81 MG PO TBEC
81.0000 mg | DELAYED_RELEASE_TABLET | Freq: Every day | ORAL | Status: DC
  Administered 2013-04-27: 81 mg via ORAL
  Filled 2013-04-26: qty 1

## 2013-04-26 MED ORDER — ENALAPRIL MALEATE 20 MG PO TABS
20.0000 mg | ORAL_TABLET | Freq: Every day | ORAL | Status: DC
  Administered 2013-04-26: 20 mg via ORAL
  Filled 2013-04-26 (×2): qty 1

## 2013-04-26 MED ORDER — EZETIMIBE 10 MG PO TABS
10.0000 mg | ORAL_TABLET | Freq: Every day | ORAL | Status: DC
  Administered 2013-04-27: 10 mg via ORAL
  Filled 2013-04-26: qty 1

## 2013-04-26 NOTE — H&P (Signed)
Physician History and Physical  Patient ID: [name] MRN: [id number] DOB/AGE: 65-Mar-1947 65 y.o. Admit date: 04/26/2013  Primary Care Physician: Paulina Fusi, MD Primary Cardiologist:  Bensimohn to see Crenshaw this month  Active Problems:   * No active hospital problems. 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  Failed angioplasty 2002 with wire perforation and subsequent single vessel CABG  Dr Juanda Chance had done procedure And was attempting to open a totally occluded stent.  No significant disease in RCA or circumflex.  Echo in 2011 showed normal EF 65%  No recent myovue.  He has felt poorly the  Last two days.  Malaise and fatigue No fever chills sputum abdominal pain or urinary tract symptoms.  Only takes crestor Tues/Fridays due to muscle pains.  This am had chest tingling that  Radiated to his left arm with some paresthesias.  Similar to previous angina.  Nitro just gave him a severe headache.  Pain gone now.  In ER no acute ECG changes and negative tropoinin  Review of systems complete and found to be negative unless listed above   Past Medical History  Diagnosis Date  . CAD (coronary artery disease)     s/p CABG  . HTN (hypertension)   . Hyperlipidemia     intolerant of all statins due to muscle aches  . Prostate cancer     s/p resection  . OSA (obstructive sleep apnea)     resolved with weight loss  . S/P colonoscopy with polypectomy   . Acid reflux     Family History  Problem Relation Age of Onset  . Diabetes Father   . Coronary artery disease Father   . Hypertension Father   . Cancer Sister   . Arthritis Mother     History   Social History  . Marital Status: Married    Spouse Name: N/A    Number of Children: 4  . Years of Education: N/A   Occupational History  . truck driver    Social History Main Topics  . Smoking status: Former Smoker    Quit date: 11/26/1987  . Smokeless tobacco: Not on file  . Alcohol Use:  Not on file  . Drug Use: Not on file  . Sexually Active: Not on file   Other Topics Concern  . Not on file   Social History Narrative  . No narrative on file    Past Surgical History  Procedure Laterality Date  . Coronary artery bypass graft  2002      (Not in a hospital admission)  Physical Exam: Blood pressure 134/82, pulse 69, temperature 98.4 F (36.9 C), temperature source Oral, resp. rate 17, SpO2 93.00%.  Affect appropriate Healthy:  appears stated age HEENT: normal Neck supple with no adenopathy JVP normal no bruits no thyromegaly Lungs clear with no wheezing and good diaphragmatic motion Heart:  S1/S2 no murmur, no rub, gallop or click PMI normal Abdomen: benighn, BS positve, no tenderness, no AAA no bruit.  No HSM or HJR Distal pulses intact with no bruits No edema Neuro non-focal Skin warm and dry No muscular weakness    Labs:   Lab Results  Component Value Date   WBC 10.0 04/26/2013   HGB 15.0 04/26/2013   HCT 43.7 04/26/2013   MCV 92.2 04/26/2013   PLT 201 04/26/2013    Recent Labs Lab 04/26/13 1506  NA 138  K 4.0  CL 100  CO2 25  BUN 16  CREATININE 0.74  CALCIUM 9.5  PROT 7.1  BILITOT 0.3  ALKPHOS 67  ALT 81*  AST 63*  GLUCOSE 98   Lab Results  Component Value Date   CKTOTAL 195 10/17/2010    Lab Results  Component Value Date   CHOL 149 04/29/2011   CHOL 144 01/14/2011   CHOL 139 10/17/2010   Lab Results  Component Value Date   HDL 42.40 04/29/2011   HDL 51.10 01/14/2011   HDL 47 10/17/2010   Lab Results  Component Value Date   LDLCALC 81 04/29/2011   LDLCALC 68 01/14/2011   LDLCALC 69 10/17/2010   Lab Results  Component Value Date   TRIG 127.0 04/29/2011   TRIG 126.0 01/14/2011   TRIG 116 10/17/2010   Lab Results  Component Value Date   CHOLHDL 4 04/29/2011   CHOLHDL 3 01/14/2011   CHOLHDL 3.0 Ratio 10/17/2010   Lab Results  Component Value Date   LDLDIRECT 144.9 07/23/2010      No current facility-administered medications  on file prior to encounter.   Current Outpatient Prescriptions on File Prior to Encounter  Medication Sig Dispense Refill  . acetaminophen (TYLENOL ARTHRITIS PAIN) 650 MG CR tablet Take 1,300 mg by mouth daily.        Marland Kitchen aspirin 81 MG tablet Take 81 mg by mouth daily.       Marland Kitchen ezetimibe (ZETIA) 10 MG tablet Take 10 mg by mouth daily.       . fexofenadine (ALLEGRA) 180 MG tablet Take 180 mg by mouth daily as needed.        . fish oil-omega-3 fatty acids 1000 MG capsule Take 3 g by mouth 2 (two) times daily.        . meloxicam (MOBIC) 15 MG tablet Take 15 mg by mouth daily as needed for pain.       Marland Kitchen spironolactone-hydrochlorothiazide (ALDACTAZIDE) 25-25 MG per tablet Take 0.5 tablets by mouth daily.  45 tablet  1   s Radiology: Dg Chest 2 View  04/26/2013   *RADIOLOGY REPORT*  Clinical Data: Left chest pain.  CHEST - 2 VIEW  Comparison: Single view of the chest 06/15/2012.  Findings: The patient is status post CABG.  Heart size is normal. Lungs are clear.  No pneumothorax or pleural fluid.  IMPRESSION: No acute disease.   Original Report Authenticated By: Holley Dexter, M.D.    EKG:  NSR normal with no acute changes  ASSESSMENT AND PLAN:   Chest Pain:  Symptoms only today Has had a very stable 12 year course post single vessel lima to LAD.  Hesitant to have invasive evaluation due to complications that required emergency                        CABG.  Continue ASA and proceed with exercise myovue in am.  He has had his hips replaced but thinks he can walk on treadmill.   Elevated LFTs:  Have been elevated as far back as 2/12  Last year were 41/55 and now 63/84  May be from statin Check hepatitis labs  No GI symptoms ETOH mild certainly not excessive HTN:  Continue ACE    Further recommendations will be based on results of serial enzymes, ECG and myovue   Signed: Theron Arista Nishan6/12/2012, 5:22 PM

## 2013-04-26 NOTE — ED Notes (Signed)
Pt ambulated to BR with steady gait. No SOB or pain.

## 2013-04-26 NOTE — ED Notes (Signed)
RN on 3W to call back for report 

## 2013-04-26 NOTE — ED Notes (Signed)
Unsuccessful IV attempt x2. MD and phlebotomy at bedside.

## 2013-04-26 NOTE — ED Notes (Signed)
Cp since this am and left hand tingling arm hurts  No n/v/sob  Does have a h/a did not feel well yesterday

## 2013-04-26 NOTE — ED Provider Notes (Signed)
History     CSN: [id number]  Arrival date & time 04/26/13  1336   First MD Initiated Contact with Patient 04/26/13 1400      Chief Complaint  Patient presents with  . Chest Pain    HPI 67 year old male with history of coronary artery disease, hypertension, hyperlipidemia, presents complaint chest pain his pain started approximately 6-7 hours prior to arrival. Started while he was at rest at home. It has been intermittent, lasting for less than 5 minutes at a time. Pain is a sharp. Moderate in severity.  It is aggravated by nothing, not aggravated by exertion. Relieved by nothing. It radiates to his left arm. He is not had any dyspnea, diaphoresis, nausea, vomiting, back pain, or any other significant symptoms. He states that it is similar to the pain that he experienced due to coronary artery disease prior to his previous bypass surgery. He is followed by [facility], Dr. Olivia Canter.   Past Medical History  Diagnosis Date  . CAD (coronary artery disease)     s/p CABG  . HTN (hypertension)   . Hyperlipidemia     intolerant of all statins due to muscle aches  . Prostate cancer     s/p resection  . OSA (obstructive sleep apnea)     resolved with weight loss  . S/P colonoscopy with polypectomy   . Acid reflux     Past Surgical History  Procedure Laterality Date  . Coronary artery bypass graft  2002    Family History  Problem Relation Age of Onset  . Diabetes Father   . Coronary artery disease Father   . Hypertension Father   . Cancer Sister   . Arthritis Mother     History  Substance Use Topics  . Smoking status: Former Smoker    Quit date: 11/26/1987  . Smokeless tobacco: Not on file  . Alcohol Use: Not on file      Review of Systems  Constitutional: Negative for fever and chills.  HENT: Negative for congestion, rhinorrhea, neck pain and neck stiffness.   Eyes: Negative for visual disturbance.  Respiratory: Negative for cough and shortness of breath.    Cardiovascular: Positive for chest pain. Negative for leg swelling.  Gastrointestinal: Negative for nausea, vomiting, abdominal pain and diarrhea.  Genitourinary: Negative for dysuria, urgency, frequency, flank pain and difficulty urinating.  Musculoskeletal: Negative for back pain.  Skin: Negative for rash.  Neurological: Negative for syncope, weakness, numbness and headaches.  All other systems reviewed and are negative.    Allergies  Codeine; Nabumetone; Statins; and Zolpidem tartrate  Home Medications   Current Outpatient Rx  Name  Route  Sig  Dispense  Refill  . acetaminophen (TYLENOL ARTHRITIS PAIN) 650 MG CR tablet   Oral   Take 1,300 mg by mouth daily.           Marland Kitchen aspirin 81 MG tablet   Oral   Take 81 mg by mouth daily.          . Cholecalciferol (VITAMIN D-3) 1000 UNITS CAPS   Oral   Take 1 capsule by mouth daily.         . enalapril (VASOTEC) 20 MG tablet   Oral   Take 10-20 mg by mouth 2 (two) times daily. 10 mg in am and 20 mg in pm         . ezetimibe (ZETIA) 10 MG tablet   Oral   Take 10 mg by mouth daily.          Marland Kitchen  fexofenadine (ALLEGRA) 180 MG tablet   Oral   Take 180 mg by mouth daily as needed.           . fish oil-omega-3 fatty acids 1000 MG capsule   Oral   Take 3 g by mouth 2 (two) times daily.           . meloxicam (MOBIC) 15 MG tablet   Oral   Take 15 mg by mouth daily as needed for pain.          . metoprolol (LOPRESSOR) 50 MG tablet   Oral   Take 25 mg by mouth 2 (two) times daily.         Marland Kitchen omeprazole (PRILOSEC) 20 MG capsule   Oral   Take 20 mg by mouth daily.         . rosuvastatin (CRESTOR) 10 MG tablet   Oral   Take 10 mg by mouth 2 (two) times a week.         . spironolactone-hydrochlorothiazide (ALDACTAZIDE) 25-25 MG per tablet   Oral   Take 0.5 tablets by mouth daily.   45 tablet   1     BP 134/82  Pulse 69  Temp(Src) 98.4 F (36.9 C) (Oral)  Resp 17  SpO2 93%  Physical Exam  Nursing  note and vitals reviewed. Constitutional: He is oriented to person, place, and time. He appears well-developed and well-nourished. No distress.  HENT:  Head: Normocephalic and atraumatic.  Mouth/Throat: Oropharynx is clear and moist.  Eyes: Conjunctivae and EOM are normal. Pupils are equal, round, and reactive to light. No scleral icterus.  Neck: Normal range of motion. Neck supple. No JVD present.  Cardiovascular: Normal rate, regular rhythm, normal heart sounds and intact distal pulses.  Exam reveals no gallop and no friction rub.   No murmur heard. Pulmonary/Chest: Effort normal and breath sounds normal. No respiratory distress. He has no wheezes. He has no rales.  Abdominal: Soft. Bowel sounds are normal. He exhibits no distension. There is no tenderness. There is no rebound and no guarding.  Musculoskeletal: He exhibits no edema.  Neurological: He is alert and oriented to person, place, and time. No cranial nerve deficit. He exhibits normal muscle tone. Coordination normal.  Skin: Skin is warm and dry. He is not diaphoretic.    ED Course  Procedures (including critical care time)  Labs Reviewed  COMPREHENSIVE METABOLIC PANEL - Abnormal; Notable for the following:    AST 63 (*)    ALT 81 (*)    All other components within normal limits  CBC  POCT I-STAT TROPONIN I   Dg Chest 2 View  04/26/2013   *RADIOLOGY REPORT*  Clinical Data: Left chest pain.  CHEST - 2 VIEW  Comparison: Single view of the chest 06/15/2012.  Findings: The patient is status post CABG.  Heart size is normal. Lungs are clear.  No pneumothorax or pleural fluid.  IMPRESSION: No acute disease.   Original Report Authenticated By: Holley Dexter, M.D.    Date: 04/26/2013  Rate: 69  Rhythm: normal sinus rhythm  QRS Axis: normal  Intervals: normal  ST/T Wave abnormalities: nonspecific T wave changes  Conduction Disutrbances:none  Narrative Interpretation:   Old EKG Reviewed: unchanged    1. Chest pain        MDM  67 year old male with a history of coronary artery disease status post CABG in 2002 presents with chest discomfort that he states is similar to pain he has had from coronary disease  in the past. Pain is mild presently. He is given aspirin in the emergency department. His EKG demonstrates some nonspecific T wave abnormalities that are similar to his old EKGs. Chest x-ray demonstrates no acute cardiopulmonary normality. His vitals are all within normal limits. He is well appearing and in no acute distress. Cardiopulmonary exam is unremarkable.  His troponin is negative x1 Labs including CBC and complete metabolic panel are remarkable only for mild elevation of his transaminases.  Consulted cardiology. Gave nitroglycerin.  He we will likely need to be admitted.  Patient admitted to cardiology for chest pain, ACS rule out, risk stratification.        Toney Sang, MD 04/27/13 [phone]

## 2013-04-26 NOTE — ED Notes (Signed)
Cardiologist MD at bedside

## 2013-04-27 ENCOUNTER — Observation Stay (HOSPITAL_COMMUNITY): Payer: 59

## 2013-04-27 ENCOUNTER — Encounter (HOSPITAL_COMMUNITY): Payer: Self-pay | Admitting: Anesthesiology

## 2013-04-27 DIAGNOSIS — I200 Unstable angina: Secondary | ICD-10-CM

## 2013-04-27 DIAGNOSIS — R072 Precordial pain: Secondary | ICD-10-CM

## 2013-04-27 LAB — HEPATITIS PANEL, ACUTE
HCV Ab: NEGATIVE
Hep A IgM: NEGATIVE
Hepatitis B Surface Ag: NEGATIVE

## 2013-04-27 MED ORDER — TECHNETIUM TC 99M SESTAMIBI GENERIC - CARDIOLITE
30.0000 | Freq: Once | INTRAVENOUS | Status: AC | PRN
  Administered 2013-04-27: 30 via INTRAVENOUS

## 2013-04-27 MED ORDER — NITROGLYCERIN 0.4 MG SL SUBL: 0.4000 mg | SUBLINGUAL_TABLET | SUBLINGUAL | Status: DC | PRN

## 2013-04-27 MED ORDER — TECHNETIUM TC 99M SESTAMIBI GENERIC - CARDIOLITE
10.0000 | Freq: Once | INTRAVENOUS | Status: AC | PRN
  Administered 2013-04-27: 10 via INTRAVENOUS

## 2013-04-27 NOTE — Progress Notes (Signed)
Patient ID: DAXX TIGGS, male   DOB: [birth year], 67 y.o.   MRN: [id number]    Subjective:  Denies SSCP, palpitations or Dyspnea Still with slight headache  Objective:  Filed Vitals:   04/26/13 1749 04/26/13 1840 04/26/13 2105 04/27/13 0536  BP: 113/67 137/94 130/89 124/75  Pulse: 65 64 67 56  Temp:  98 F (36.7 C) 97.6 F (36.4 C) 98 F (36.7 C)  TempSrc:  Oral Oral Oral  Resp: 14 18 18 18   Height:  6' (1.829 m)    Weight:  211 lb (95.709 kg)  212 lb 4.9 oz (96.3 kg)  SpO2: 93% 92% 97% 96%    Intake/Output from previous day: No intake or output data in the 24 hours ending 04/27/13 0810  Physical Exam: Affect appropriate Healthy:  appears stated age HEENT: normal Neck supple with no adenopathy JVP normal no bruits no thyromegaly Lungs clear with no wheezing and good diaphragmatic motion Heart:  S1/S2 no murmur, no rub, gallop or click PMI normal Abdomen: benighn, BS positve, no tenderness, no AAA no bruit.  No HSM or HJR Distal pulses intact with no bruits No edema Neuro non-focal Skin warm and dry No muscular weakness   Lab Results: Basic Metabolic Panel:  Recent Labs  40/98/11 1506  NA 138  K 4.0  CL 100  CO2 25  GLUCOSE 98  BUN 16  CREATININE 0.74  CALCIUM 9.5   Liver Function Tests:  Recent Labs  04/26/13 1506  AST 63*  ALT 81*  ALKPHOS 67  BILITOT 0.3  PROT 7.1  ALBUMIN 3.6   CBC:  Recent Labs  04/26/13 1506  WBC 10.0  HGB 15.0  HCT 43.7  MCV 92.2  PLT 201   Cardiac Enzymes:  Recent Labs  04/27/13 0525  TROPONINI <0.30   Imaging: Dg Chest 2 View  04/26/2013   *RADIOLOGY REPORT*  Clinical Data: Left chest pain.  CHEST - 2 VIEW  Comparison: Single view of the chest 06/15/2012.  Findings: The patient is status post CABG.  Heart size is normal. Lungs are clear.  No pneumothorax or pleural fluid.  IMPRESSION: No acute disease.   Original Report Authenticated By: Holley Dexter, M.D.    Cardiac Studies:  ECG:  NSR no  acute ischemic changes 04/27/2013    Telemetry: NSR no arrhytyhmia  Echo:   Medications:   . aspirin EC  81 mg Oral Daily  . enalapril  10 mg Oral Daily  . enalapril  20 mg Oral QHS  . ezetimibe  10 mg Oral Daily  . metoprolol  25 mg Oral BID  . pantoprazole  40 mg Oral Daily  . rosuvastatin  10 mg Oral Custom       Assessment/Plan:  Chest Pain: atypical negative troponin and normal ECG  In nuclear for resting myovue images.  Cath latter today if positive (images should be ready in time) discharge home if normal Chol:  Would stop crestor for 2 months and see if LFT;s go back to normal.  If not will need GI to see Contnue zetia HTN:  Continue enalapril Headache:  Presumed from nitro improved no focal neuro signs  Charlton Haws 04/27/2013, 8:11 AM

## 2013-04-27 NOTE — Progress Notes (Signed)
Utilization review completed.  

## 2013-04-27 NOTE — Progress Notes (Signed)
Stress cardiolite completed. Dayna Dunn PA-C

## 2013-04-27 NOTE — Progress Notes (Signed)
Pt discharged to home per MD order. Pt received and reviewed all discharge instructions and medication information including follow-up appointments and prescriptions. Pt verbalized understanding. Pt alert and oriented at discharge with no complaints of pain.  Pt escorted to private vehicle via wheelchair by nurse tech. Coates, Alicya Bena Elizabeth  

## 2013-04-27 NOTE — Discharge Summary (Signed)
Patient ID: [name],  MRN: [id number], DOB/AGE: 1946/01/18 67 y.o.  Admit date: 04/26/2013 Discharge date: 04/27/2013  Primary Care Provider: [id number],VZDGLOV E Primary Cardiologist: P. Eden Emms, MD (pt requested)  Discharge Diagnoses Principal Problem:   Unstable angina Active Problems:   Coronary atherosclerosis of native coronary artery   HYPERLIPIDEMIA-MIXED   HYPERTENSION, BENIGN   BRADYCARDIA  Allergies Allergies  Allergen Reactions  . Codeine   . Nabumetone   . Statins   . Zolpidem Tartrate    Procedures  Exercise Cardiolite 6.3.2014  Findings:  The patient exercised 10 minutes on the Bruce protocol. There is no chest pain.  He reach the peak heart rate of 133 representing 85% predicted maximum heart rate.  There is no significant EKG change.  Overall the EKG component of this study shows no significant abnormality.  The raw nuclear data reveals no evidence of excess motion.  Tomographic images with stress revealed normal uptake in all segments.  Tomographic images at rest revealed normal uptake in all segments.  There is no sign of ischemia. There is no evidence of scar.  Wall motion assessment reveals an ejection fraction of 60%.  There is question of some hypokinesis of the septum and the inferior wall.  I cannot be sure if this is a real finding or  related to the computer program.  IMPRESSION: The study shows no evidence of scar or ischemia.  This is a low risk scan.  The ejection fraction is normal.  There is question of decreased motion of the   septum inferior wall.  I cannot tell if this is artifactual or not. _____________  History of Present Illness  66 y/o male with prior h/o CAD s/p single vessel CABG in 2002 (LIMA->LAD).  He had been doing well from a cardiac standpoint and was in his usual state of health until 2 days prior to admission, when he began to experience malaise and fatigue without associated symptoms.  On the morning of admission,  he developed chest "tingling" that radiated to his left arm and was associated with left arm paresthesias.  He felt that these symptoms were similar to prior angina.  He presented to the [facility] ED where pain eventually subsided.  ECG showed no acute changes.  Initial troponin was normal.  He was admitted for further evaluation.  Hospital Course  Patient ruled out for MI.  He had no further chest pain.  In the absence of objective evidence of ischemia, decision was made to pursue Exercise Cardiolite.  This was performed this AM and showed no evidence of ischemia or infarct with normal LV function.  He will be discharged home today in good condition.  In the setting of his fatigue and malaise, we have recommended a crestor holiday, as he has prior h/o statin intolerance and does experience muscle aches related to crestor usage, which has limited his dosing to twice weekly in the past.   Discharge Vitals Blood pressure 116/87, pulse 85, temperature 98.8 F (37.1 C), temperature source Oral, resp. rate 16, height 6' (1.829 m), weight 212 lb 4.9 oz (96.3 kg), SpO2 97.00%.  Filed Weights   04/26/13 1840 04/27/13 0536  Weight: 211 lb (95.709 kg) 212 lb 4.9 oz (96.3 kg)   Labs  CBC  Recent Labs  04/26/13 1506  WBC 10.0  HGB 15.0  HCT 43.7  MCV 92.2  PLT 201   Basic Metabolic Panel  Recent Labs  04/26/13 1506  NA 138  K 4.0  CL 100  CO2 25  GLUCOSE 98  BUN 16  CREATININE 0.74  CALCIUM 9.5   Liver Function Tests  Recent Labs  04/26/13 1506  AST 63*  ALT 81*  ALKPHOS 67  BILITOT 0.3  PROT 7.1  ALBUMIN 3.6   Cardiac Enzymes  Recent Labs  04/27/13 0525  TROPONINI <0.30   Disposition  Pt is being discharged home today in good condition.  Follow-up Plans & Appointments      Follow-up Information   Follow up with Charlton Haws, MD On 06/14/2013. (2:45 PM)    Contact information:   [address] [place] [address] [phone]      Discharge  Medications    Medication List    STOP taking these medications       rosuvastatin 10 MG tablet  Commonly known as:  CRESTOR      TAKE these medications       aspirin 81 MG tablet  Take 81 mg by mouth daily.     enalapril 20 MG tablet  Commonly known as:  VASOTEC  Take 10-20 mg by mouth 2 (two) times daily. 10 mg in am and 20 mg in pm     ezetimibe 10 MG tablet  Commonly known as:  ZETIA  Take 10 mg by mouth daily.     fexofenadine 180 MG tablet  Commonly known as:  ALLEGRA  Take 180 mg by mouth daily as needed.     fish oil-omega-3 fatty acids 1000 MG capsule  Take 3 g by mouth 2 (two) times daily.     meloxicam 15 MG tablet  Commonly known as:  MOBIC  Take 15 mg by mouth daily as needed for pain.     metoprolol 50 MG tablet  Commonly known as:  LOPRESSOR  Take 25 mg by mouth 2 (two) times daily.     nitroGLYCERIN 0.4 MG SL tablet  Commonly known as:  NITROSTAT  Place 1 tablet (0.4 mg total) under the tongue every 5 (five) minutes as needed for chest pain (CP or SOB).     omeprazole 20 MG capsule  Commonly known as:  PRILOSEC  Take 20 mg by mouth daily.     spironolactone-hydrochlorothiazide 25-25 MG per tablet  Commonly known as:  ALDACTAZIDE  Take 0.5 tablets by mouth daily.     TYLENOL ARTHRITIS PAIN 650 MG CR tablet  Generic drug:  acetaminophen  Take 1,300 mg by mouth daily.     Vitamin D-3 1000 UNITS Caps  Take 1 capsule by mouth daily.      Outstanding Labs/Studies  none  Duration of Discharge Encounter   Greater than 30 minutes including physician time.  Signed, Nicolasa Ducking NP 04/27/2013, 3:23 PM

## 2013-04-28 NOTE — ED Provider Notes (Signed)
I saw and evaluated the patient, reviewed the resident's note and I agree with the findings and plan.  I personally evaluated the ECG and agree with the interpretation of the resident  Patient with atypical nonspecific chest pain.  Given his significant cardiac disease we've asked cardiology to evaluate the patient the bedside.  Lyanne Co, MD 04/28/13 [phone]

## 2013-04-29 ENCOUNTER — Telehealth: Payer: Self-pay | Admitting: Nurse Practitioner

## 2013-04-29 NOTE — Telephone Encounter (Signed)
Patient contacted regarding discharge from [facility] on 6/3.  Patient understands to follow up with provider Dr. Eden Emms on 7/21 at 10:15 at [facility]. Patient understands discharge instructions? Yes Patient understands medications and regiment? Yes Patient understands to bring all medications to this visit? Yes  Patient states he is feeling better since his discharge from the hospital and denies complaints or concerns.  Patient instructed to call us if he has any questions or concerns.

## 2013-05-25 ENCOUNTER — Other Ambulatory Visit: Payer: Self-pay

## 2013-05-25 MED ORDER — METOPROLOL TARTRATE 50 MG PO TABS: 25.0000 mg | ORAL_TABLET | Freq: Two times a day (BID) | ORAL | Status: DC

## 2013-06-14 ENCOUNTER — Encounter: Payer: Self-pay | Admitting: Cardiovascular Disease

## 2013-06-14 ENCOUNTER — Encounter: Payer: Self-pay | Admitting: *Deleted

## 2013-06-14 ENCOUNTER — Ambulatory Visit (INDEPENDENT_AMBULATORY_CARE_PROVIDER_SITE_OTHER): Payer: 59 | Admitting: Cardiovascular Disease

## 2013-06-14 VITALS — BP 122/82 | HR 68 | Wt 217.0 lb

## 2013-06-14 DIAGNOSIS — I25810 Atherosclerosis of coronary artery bypass graft(s) without angina pectoris: Secondary | ICD-10-CM

## 2013-06-14 DIAGNOSIS — I10 Essential (primary) hypertension: Secondary | ICD-10-CM

## 2013-06-14 DIAGNOSIS — E785 Hyperlipidemia, unspecified: Secondary | ICD-10-CM

## 2013-06-14 NOTE — Assessment & Plan Note (Signed)
Well controlled.  Continue current medications and low sodium Dash type diet.    

## 2013-06-14 NOTE — Progress Notes (Signed)
Patient ID: [name], male   DOB: 03/28/1946, 67 y.o.   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 History of CAD Previous stents to LAD. Failed angioplasty 2002 with wire perforation and subsequent single vessel CABG Dr Juanda Chance had done procedure  And was attempting to open a totally occluded stent. No significant disease in RCA or circumflex. Echo in 2011 showed normal EF 65% No recent myovue.  Recent hospitalization in June with chest pain R/O myovue normal.  Had malaise and fatigue and statin stopped.   Feels better with less malaise and cramping  Intolerant to crestor and lipitor  Spent 20 minutes filling out DOT and FMLA forms for being out of work 6/2-6/6    ROS: Denies fever, malais, weight loss, blurry vision, decreased visual acuity, cough, sputum, SOB, hemoptysis, pleuritic pain, palpitaitons, heartburn, abdominal pain, melena, lower extremity edema, claudication, or rash.  All other systems reviewed and negative  General: Affect appropriate Healthy:  appears stated age HEENT: normal Neck supple with no adenopathy JVP normal no bruits no thyromegaly Lungs clear with no wheezing and good diaphragmatic motion Heart:  S1/S2 no murmur, no rub, gallop or click PMI normal Abdomen: benighn, BS positve, no tenderness, no AAA no bruit.  No HSM or HJR Distal pulses intact with no bruits No edema Neuro non-focal Skin warm and dry No muscular weakness   Current Outpatient Prescriptions  Medication Sig Dispense Refill  . acetaminophen (TYLENOL ARTHRITIS PAIN) 650 MG CR tablet Take 1,300 mg by mouth daily.        Marland Kitchen aspirin 81 MG tablet Take 81 mg by mouth daily.       . Cholecalciferol (VITAMIN D-3) 1000 UNITS CAPS Take 1 capsule by mouth daily.      . Coenzyme Q10 (COQ-10 PO) Take 1 tablet by mouth daily.      . enalapril (VASOTEC) 20 MG tablet Take 10-20 mg by mouth 2 (two) times daily. 10 mg in am and 20 mg in pm      . ezetimibe (ZETIA) 10 MG tablet Take 10 mg by mouth daily.        . fexofenadine (ALLEGRA) 180 MG tablet Take 180 mg by mouth daily as needed.        . fish oil-omega-3 fatty acids 1000 MG capsule Take 3 g by mouth 2 (two) times daily.        . meloxicam (MOBIC) 15 MG tablet Take 15 mg by mouth daily as needed for pain.       . metoprolol (LOPRESSOR) 50 MG tablet Take 0.5 tablets (25 mg total) by mouth 2 (two) times daily.  30 tablet  0  . nitroGLYCERIN (NITROSTAT) 0.4 MG SL tablet Place 1 tablet (0.4 mg total) under the tongue every 5 (five) minutes as needed for chest pain (CP or SOB).  25 tablet  3  . omeprazole (PRILOSEC) 20 MG capsule Take 20 mg by mouth daily.      Marland Kitchen spironolactone-hydrochlorothiazide (ALDACTAZIDE) 25-25 MG per tablet Take 0.5 tablets by mouth daily.  45 tablet  1   No current facility-administered medications for this visit.    Allergies  Codeine; Nabumetone; Statins; and Zolpidem tartrate  Electrocardiogram:  04/27/13  SR normal ECG   Assessment and Plan

## 2013-06-14 NOTE — Assessment & Plan Note (Signed)
Stable no pain myovue 04/26/13 non  Ischemic

## 2013-06-14 NOTE — Patient Instructions (Signed)
Your physician wants you to follow-up in:     6 MONTHS WITH DR Haywood Filler will receive a reminder letter in the mail two months in advance. If you don't receive a letter, please call our office to schedule the follow-up appointment. Your physician recommends that you continue on your current medications as directed. Please refer to the Current Medication list given to you today.  Your physician recommends that you return for lab work in: HAVE DONE IN  [place]  FASTING LIPID LIVER

## 2013-06-14 NOTE — Assessment & Plan Note (Signed)
F/U lipid and liver with Dr Tomasa Blase in 4 weeks Consider trial of zocor 20 mg 3x/week

## 2013-06-18 ENCOUNTER — Telehealth: Payer: Self-pay | Admitting: Nurse Practitioner

## 2013-06-18 NOTE — Telephone Encounter (Signed)
Received call from Dr. Andi Devon at [facility] requesting copy of patient's stress test 04/27/13 for patient's DOT exam.  Results faxed to [phone]

## 2013-06-21 ENCOUNTER — Ambulatory Visit: Payer: 59 | Admitting: Cardiology

## 2013-07-12 ENCOUNTER — Encounter: Payer: Self-pay | Admitting: Cardiovascular Disease

## 2013-07-14 ENCOUNTER — Other Ambulatory Visit: Payer: Self-pay | Admitting: *Deleted

## 2013-07-14 MED ORDER — ENALAPRIL MALEATE 20 MG PO TABS: 10.0000 mg | ORAL_TABLET | Freq: Two times a day (BID) | ORAL | Status: DC

## 2013-09-21 ENCOUNTER — Other Ambulatory Visit: Payer: Self-pay

## 2013-09-21 MED ORDER — ENALAPRIL MALEATE 20 MG PO TABS: 10.0000 mg | ORAL_TABLET | Freq: Two times a day (BID) | ORAL | Status: DC

## 2013-09-30 ENCOUNTER — Other Ambulatory Visit: Payer: Self-pay

## 2013-09-30 MED ORDER — SPIRONOLACTONE-HCTZ 25-25 MG PO TABS: 0.5000 | ORAL_TABLET | Freq: Every day | ORAL | Status: DC

## 2013-11-24 ENCOUNTER — Other Ambulatory Visit: Payer: Self-pay | Admitting: Internal Medicine

## 2013-11-26 ENCOUNTER — Other Ambulatory Visit: Payer: Self-pay

## 2013-11-26 MED ORDER — METOPROLOL TARTRATE 50 MG PO TABS: ORAL_TABLET | ORAL | Status: DC

## 2013-12-20 ENCOUNTER — Encounter: Payer: Self-pay | Admitting: Cardiovascular Disease

## 2013-12-20 ENCOUNTER — Ambulatory Visit (INDEPENDENT_AMBULATORY_CARE_PROVIDER_SITE_OTHER): Payer: 59 | Admitting: Cardiovascular Disease

## 2013-12-20 VITALS — BP 115/80 | HR 68 | Ht 72.0 in | Wt 214.0 lb

## 2013-12-20 DIAGNOSIS — I10 Essential (primary) hypertension: Secondary | ICD-10-CM

## 2013-12-20 DIAGNOSIS — E785 Hyperlipidemia, unspecified: Secondary | ICD-10-CM

## 2013-12-20 DIAGNOSIS — I2510 Atherosclerotic heart disease of native coronary artery without angina pectoris: Secondary | ICD-10-CM

## 2013-12-20 MED ORDER — SPIRONOLACTONE-HCTZ 25-25 MG PO TABS: 0.5000 | ORAL_TABLET | Freq: Every day | ORAL | Status: DC

## 2013-12-20 MED ORDER — EZETIMIBE 10 MG PO TABS: 10.0000 mg | ORAL_TABLET | Freq: Every day | ORAL | Status: DC

## 2013-12-20 MED ORDER — ENALAPRIL MALEATE 20 MG PO TABS: 10.0000 mg | ORAL_TABLET | Freq: Two times a day (BID) | ORAL | Status: DC

## 2013-12-20 MED ORDER — METOPROLOL TARTRATE 50 MG PO TABS: ORAL_TABLET | ORAL | Status: DC

## 2013-12-20 NOTE — Progress Notes (Signed)
Patient ID: [name], male   DOB: [birth year], 68 y.o.   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 Failed angioplasty 2002 with wire perforation and subsequent single vessel CABG Dr Olevia Perches had done procedure  And was attempting to open a totally occluded stent. No significant disease in RCA or circumflex. Echo in 2011 showed normal EF 65% No recent myovue. Recent hospitalization in June with chest pain R/O myovue normal. Had malaise and fatigue and statin stopped. Feels better with less malaise and cramping  Intolerant to crestor and lipitor     ROS: Denies fever, malais, weight loss, blurry vision, decreased visual acuity, cough, sputum, SOB, hemoptysis, pleuritic pain, palpitaitons, heartburn, abdominal pain, melena, lower extremity edema, claudication, or rash.  All other systems reviewed and negative  General: Affect appropriate Healthy:  appears stated age 24: normal Neck supple with no adenopathy JVP normal no bruits no thyromegaly Lungs clear with no wheezing and good diaphragmatic motion Heart:  S1/S2 no murmur, no rub, gallop or click PMI normal Abdomen: benighn, BS positve, no tenderness, no AAA no bruit.  No HSM or HJR Distal pulses intact with no bruits No edema Neuro non-focal Skin warm and dry No muscular weakness   Current Outpatient Prescriptions  Medication Sig Dispense Refill  . acetaminophen (TYLENOL ARTHRITIS PAIN) 650 MG CR tablet Take 1,300 mg by mouth daily.        Marland Kitchen aspirin 81 MG tablet Take 81 mg by mouth daily.       . Cholecalciferol (VITAMIN D-3) 1000 UNITS CAPS Take 1 capsule by mouth daily.      . Coenzyme Q10 (COQ-10 PO) Take 1 tablet by mouth daily.      . enalapril (VASOTEC) 20 MG tablet Take 0.5-1 tablets (10-20 mg total) by mouth 2 (two) times daily. 10 mg in am and 20 mg in pm  135 tablet  1  . ezetimibe (ZETIA) 10 MG tablet Take 10 mg by mouth daily.       . fexofenadine (ALLEGRA) 180 MG tablet Take 180  mg by mouth daily as needed.        . fish oil-omega-3 fatty acids 1000 MG capsule Take 3 g by mouth 2 (two) times daily.        . metoprolol (LOPRESSOR) 50 MG tablet TAKE 1/2 TABLET BY MOUTH TWICE A DAY  90 tablet  1  . nitroGLYCERIN (NITROSTAT) 0.4 MG SL tablet Place 1 tablet (0.4 mg total) under the tongue every 5 (five) minutes as needed for chest pain (CP or SOB).  25 tablet  3  . omeprazole (PRILOSEC) 20 MG capsule Take 20 mg by mouth daily.      Marland Kitchen spironolactone-hydrochlorothiazide (ALDACTAZIDE) 25-25 MG per tablet Take 0.5 tablets by mouth daily.  45 tablet  2   No current facility-administered medications for this visit.    Allergies  Codeine; Nabumetone; Statins; and Zolpidem tartrate  Electrocardiogram:  6/14  SR rate 69  Normal   Assessment and Plan

## 2013-12-20 NOTE — Assessment & Plan Note (Signed)
Well controlled.  Continue current medications and low sodium Dash type diet.    

## 2013-12-20 NOTE — Patient Instructions (Signed)
Your physician wants you to follow-up in: YEAR WITH DR NISHAN  You will receive a reminder letter in the mail two months in advance. If you don't receive a letter, please call our office to schedule the follow-up appointment.  Your physician recommends that you continue on your current medications as directed. Please refer to the Current Medication list given to you today. 

## 2013-12-20 NOTE — Assessment & Plan Note (Signed)
Stable with no angina and good activity level.  Continue medical Rx  

## 2013-12-20 NOTE — Assessment & Plan Note (Signed)
Cholesterol is at goal.  Continue current dose of statin and diet Rx.  No myalgias or side effects.  F/U  LFT's in 6 months. Lab Results  Component Value Date   LDLCALC 81 04/29/2011  F/U labs with Dr Delena Bali in [place]

## 2014-05-30 ENCOUNTER — Telehealth: Payer: Self-pay | Admitting: Cardiovascular Disease

## 2014-05-30 DIAGNOSIS — I25700 Atherosclerosis of coronary artery bypass graft(s), unspecified, with unstable angina pectoris: Secondary | ICD-10-CM

## 2014-05-30 NOTE — Telephone Encounter (Signed)
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 Please fax to Attn: Wandra Mannan [phone] @ [facility] in [place]. Pt stated he need it fax'd ASAP.

## 2014-05-30 NOTE — Telephone Encounter (Signed)
WILL FORWARD TO  DR Johnsie Cancel FOR  REVIEW , HAVE NOT  SEEN SINCE January 2015. 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

## 2014-05-30 NOTE — Telephone Encounter (Signed)
Last myovue was June of 2014  Needs another one for DOT to clear to drive

## 2014-06-02 NOTE — Telephone Encounter (Signed)
PT  AWARE./CY 

## 2014-06-07 ENCOUNTER — Encounter: Payer: Self-pay | Admitting: Internal Medicine

## 2014-06-20 ENCOUNTER — Ambulatory Visit (HOSPITAL_COMMUNITY): Payer: 59 | Attending: Cardiology | Admitting: Radiology

## 2014-06-20 ENCOUNTER — Telehealth: Payer: Self-pay | Admitting: Cardiovascular Disease

## 2014-06-20 VITALS — BP 135/86 | HR 54 | Ht 72.0 in | Wt 214.0 lb

## 2014-06-20 DIAGNOSIS — Z8249 Family history of ischemic heart disease and other diseases of the circulatory system: Secondary | ICD-10-CM | POA: Insufficient documentation

## 2014-06-20 DIAGNOSIS — Z951 Presence of aortocoronary bypass graft: Secondary | ICD-10-CM | POA: Insufficient documentation

## 2014-06-20 DIAGNOSIS — I2510 Atherosclerotic heart disease of native coronary artery without angina pectoris: Secondary | ICD-10-CM | POA: Insufficient documentation

## 2014-06-20 DIAGNOSIS — I252 Old myocardial infarction: Secondary | ICD-10-CM | POA: Insufficient documentation

## 2014-06-20 DIAGNOSIS — I25700 Atherosclerosis of coronary artery bypass graft(s), unspecified, with unstable angina pectoris: Secondary | ICD-10-CM

## 2014-06-20 DIAGNOSIS — Z9861 Coronary angioplasty status: Secondary | ICD-10-CM | POA: Insufficient documentation

## 2014-06-20 DIAGNOSIS — Z87891 Personal history of nicotine dependence: Secondary | ICD-10-CM | POA: Insufficient documentation

## 2014-06-20 DIAGNOSIS — Z0289 Encounter for other administrative examinations: Secondary | ICD-10-CM | POA: Diagnosis not present

## 2014-06-20 MED ORDER — TECHNETIUM TC 99M SESTAMIBI GENERIC - CARDIOLITE
11.0000 | Freq: Once | INTRAVENOUS | Status: AC | PRN
  Administered 2014-06-20: 11 via INTRAVENOUS

## 2014-06-20 MED ORDER — TECHNETIUM TC 99M SESTAMIBI GENERIC - CARDIOLITE
33.0000 | Freq: Once | INTRAVENOUS | Status: AC | PRN
  Administered 2014-06-20: 33 via INTRAVENOUS

## 2014-06-20 NOTE — Progress Notes (Signed)
[facility] [place], [place] [address] [phone]    Cardiology Nuclear Med Study  [name] is a 68 y.o. male     MRN : [id number]     DOB: 05/26/1946  Procedure Date: 06/20/2014  Nuclear Med Background Indication for Stress Test:  Evaluation for Ischemia, Graft Patency, Stent Patency and DOT Clearance History:  CAD, MI, Cath, ABG, Stent, Echo 2011 EF 65%, MPI 2014 (normal) EF 60% Cardiac Risk Factors:Premature Family History - CAD, History of Smoking, Hypertension and Lipids  Symptoms:  None indicated   Nuclear Pre-Procedure Caffeine/Decaff Intake:  None> 12 hrs NPO After: 7:30pm   Lungs:  clear O2 Sat: 93% on room air. IV 0.9% NS with Angio Cath:  22g  IV Site: R Wrist x 1, tolerated well IV Started by:  Irven Baltimore, RN  Chest Size (in):  44 Cup Size: n/a  Height: 6' (1.829 m)  Weight:  214 lb (97.07 kg)  BMI:  Body mass index is 29.02 kg/(m^2). Tech Comments:  Patient took am medications and Lopressor per MD. Irven Baltimore, RN.    Nuclear Med Study 1 or 2 day study: 1 day  Stress Test Type:  Stress  Reading MD: N/A  Order Authorizing Provider:  [name], MD  Resting Radionuclide: Technetium 37m Sestamibi  Resting Radionuclide Dose: 11.0 mCi   Stress Radionuclide:  Technetium 82m Sestamibi  Stress Radionuclide Dose: 33.0 mCi           Stress Protocol Rest HR: 54 Stress HR: 136  Rest BP: 135/86 Stress BP: 128/93  Exercise Time (min): 9:00 METS: 10.1           Dose of Adenosine (mg):  n/a Dose of Lexiscan: n/a mg  Dose of Atropine (mg): n/a Dose of Dobutamine: n/a mcg/kg/min (at max HR)  Stress Test Technologist: Glade Lloyd, BS-ES  Nuclear Technologist:  Vedia Pereyra, CNMT     Rest Procedure:  Myocardial perfusion imaging was performed at rest 45 minutes following the intravenous administration of Technetium 40m Sestamibi. Rest ECG: NSR - Normal EKG  Stress Procedure:  The patient exercised on  the treadmill utilizing the Bruce Protocol for 9:00 minutes. The patient stopped due to fatigue and denied any chest pain.  Technetium 65m Sestamibi was injected at peak exercise and myocardial perfusion imaging was performed after a brief delay. Stress ECG: No significant change from baseline ECG with isolated ventricular couplet  QPS Raw Data Images:  Mild diaphragmatic attenuation.  Normal left ventricular size. Stress Images:  Normal homogeneous uptake in all areas of the myocardium. Rest Images:  Normal homogeneous uptake in all areas of the myocardium. Subtraction (SDS):  No evidence of ischemia. Transient Ischemic Dilatation (Normal <1.22):  0.93 Lung/Heart Ratio (Normal <0.45):  0.44  Quantitative Gated Spect Images QGS EDV:  80 ml QGS ESV:  29 ml  Impression Exercise Capacity:  Good exercise capacity. BP Response:  Normal blood pressure response. Clinical Symptoms:  No significant symptoms noted. ECG Impression:  No significant ST segment change suggestive of ischemia. Comparison with Prior Nuclear Study: No significant change from previous study  Overall Impression:  Normal stress nuclear study.  LV Ejection Fraction: 64%.  LV Wall Motion:  NL LV Function; NL Wall Motion  Signed: Fransico Him, MD [place]

## 2014-06-20 NOTE — Telephone Encounter (Signed)
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 Advised that stress test report has is not finished yet, that after it is completed Dr. Johnsie Cancel needs to review it. Also advised that Dr. Johnsie Cancel is away this entire week as is his primary nurse.

## 2014-06-20 NOTE — Telephone Encounter (Signed)
° °  Patient had stress test done this morning.  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

## 2014-06-24 NOTE — Telephone Encounter (Signed)
Follow up    Pt want stress test results---can another doctor sign off so that he can drive?

## 2014-06-24 NOTE — Telephone Encounter (Signed)
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 He needs to go back to work Monday night.  He can pick up the letter Monday. Please call when letter is ready.  Pt has been out of work now over a week. "It's really working on me & I need to go back to work and get paid" He is aware Dr. Johnsie Cancel is out of the office today & is quite understanding.  Forwarded to Dr. Johnsie Cancel & Daphane Shepherd RN

## 2014-06-26 NOTE — Telephone Encounter (Signed)
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

## 2014-06-27 ENCOUNTER — Encounter: Payer: Self-pay | Admitting: *Deleted

## 2014-06-27 ENCOUNTER — Telehealth: Payer: Self-pay | Admitting: Cardiovascular Disease

## 2014-06-27 ENCOUNTER — Telehealth: Payer: Self-pay | Admitting: Cardiology

## 2014-06-27 NOTE — Telephone Encounter (Signed)
New Prob    Requested a faxed copy of most recent EF. Please fax to [phone].

## 2014-06-27 NOTE — Telephone Encounter (Signed)
PT  AWARE OF  MYOVIEW  RESULTS  LETTER  DONE  AND  LEFT  AT FRONT  DESK  FOR  PICK UP .Adonis Housekeeper

## 2014-06-27 NOTE — Telephone Encounter (Signed)
MYOVIEW  RESULTS  FAXED TO LISTED NUMBER./CY

## 2014-11-02 DIAGNOSIS — Z125 Encounter for screening for malignant neoplasm of prostate: Secondary | ICD-10-CM | POA: Diagnosis not present

## 2014-11-02 DIAGNOSIS — M7702 Medial epicondylitis, left elbow: Secondary | ICD-10-CM | POA: Diagnosis not present

## 2014-11-02 DIAGNOSIS — Z6830 Body mass index (BMI) 30.0-30.9, adult: Secondary | ICD-10-CM | POA: Diagnosis not present

## 2014-11-03 DIAGNOSIS — Z6831 Body mass index (BMI) 31.0-31.9, adult: Secondary | ICD-10-CM | POA: Diagnosis not present

## 2014-11-03 DIAGNOSIS — I10 Essential (primary) hypertension: Secondary | ICD-10-CM | POA: Diagnosis not present

## 2014-11-03 DIAGNOSIS — M7712 Lateral epicondylitis, left elbow: Secondary | ICD-10-CM | POA: Diagnosis not present

## 2014-11-03 DIAGNOSIS — E785 Hyperlipidemia, unspecified: Secondary | ICD-10-CM | POA: Diagnosis not present

## 2014-11-03 DIAGNOSIS — Z1389 Encounter for screening for other disorder: Secondary | ICD-10-CM | POA: Diagnosis not present

## 2014-11-03 DIAGNOSIS — Z9181 History of falling: Secondary | ICD-10-CM | POA: Diagnosis not present

## 2014-11-03 DIAGNOSIS — M159 Polyosteoarthritis, unspecified: Secondary | ICD-10-CM | POA: Diagnosis not present

## 2014-11-03 DIAGNOSIS — M25522 Pain in left elbow: Secondary | ICD-10-CM | POA: Diagnosis not present

## 2014-11-29 DIAGNOSIS — Z9079 Acquired absence of other genital organ(s): Secondary | ICD-10-CM | POA: Diagnosis not present

## 2014-11-29 DIAGNOSIS — Z8546 Personal history of malignant neoplasm of prostate: Secondary | ICD-10-CM | POA: Diagnosis not present

## 2014-11-29 DIAGNOSIS — C61 Malignant neoplasm of prostate: Secondary | ICD-10-CM | POA: Diagnosis not present

## 2014-11-29 DIAGNOSIS — Z87891 Personal history of nicotine dependence: Secondary | ICD-10-CM | POA: Diagnosis not present

## 2014-11-29 DIAGNOSIS — Z125 Encounter for screening for malignant neoplasm of prostate: Secondary | ICD-10-CM | POA: Diagnosis not present

## 2014-12-15 DIAGNOSIS — C61 Malignant neoplasm of prostate: Secondary | ICD-10-CM | POA: Diagnosis not present

## 2014-12-15 DIAGNOSIS — R972 Elevated prostate specific antigen [PSA]: Secondary | ICD-10-CM | POA: Diagnosis not present

## 2014-12-15 DIAGNOSIS — Z9079 Acquired absence of other genital organ(s): Secondary | ICD-10-CM | POA: Diagnosis not present

## 2014-12-15 DIAGNOSIS — Z9889 Other specified postprocedural states: Secondary | ICD-10-CM | POA: Diagnosis not present

## 2014-12-15 DIAGNOSIS — Z0389 Encounter for observation for other suspected diseases and conditions ruled out: Secondary | ICD-10-CM | POA: Diagnosis not present

## 2014-12-15 DIAGNOSIS — Z8546 Personal history of malignant neoplasm of prostate: Secondary | ICD-10-CM | POA: Diagnosis not present

## 2014-12-19 DIAGNOSIS — J208 Acute bronchitis due to other specified organisms: Secondary | ICD-10-CM | POA: Diagnosis not present

## 2014-12-19 DIAGNOSIS — Z6830 Body mass index (BMI) 30.0-30.9, adult: Secondary | ICD-10-CM | POA: Diagnosis not present

## 2014-12-19 NOTE — Progress Notes (Signed)
Patient ID: [name], male   DOB: 02-13-1946, 69 y.o.   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 Failed angioplasty 2002 with wire perforation and subsequent single vessel CABG  Dr Olevia Perches had done procedure And was attempting to open a totally occluded stent. No significant disease in RCA or circumflex. Echo in 2011 showed normal EF 65% No recent myovue. Hospitalization in 7/15  with chest pain R/O myovue normal. Had malaise and fatigue and statin stopped. Feels better with less malaise and cramping  Intolerant to crestor and lipitor  Zetia too expensive and he may stop it in 3 months  Last lipid profile with Dr Delena Bali in August Resuts not available to [place]    ROS: Denies fever, malais, weight loss, blurry vision, decreased visual acuity, cough, sputum, SOB, hemoptysis, pleuritic pain, palpitaitons, heartburn, abdominal pain, melena, lower extremity edema, claudication, or rash.  All other systems reviewed and negative  General: Affect appropriate Healthy:  appears stated age 69: normal Neck supple with no adenopathy JVP normal no bruits no thyromegaly Lungs clear with no wheezing and good diaphragmatic motion Heart:  S1/S2 no murmur, no rub, gallop or click PMI normal Abdomen: benighn, BS positve, no tenderness, no AAA no bruit.  No HSM or HJR Distal pulses intact with no bruits No edema Neuro non-focal Skin warm and dry No muscular weakness   Current Outpatient Prescriptions  Medication Sig Dispense Refill  . acetaminophen (TYLENOL ARTHRITIS PAIN) 650 MG CR tablet Take 1,300 mg by mouth daily.      Marland Kitchen aspirin 81 MG tablet Take 81 mg by mouth daily.     . Cholecalciferol (VITAMIN D-3) 1000 UNITS CAPS Take 1 capsule by mouth daily.    . Coenzyme Q10 (COQ-10 PO) Take 1 tablet by mouth daily.    . enalapril (VASOTEC) 20 MG tablet Take 0.5-1 tablets (10-20 mg total) by mouth 2 (two) times daily. 10 mg in am and 20 mg in pm 135 tablet 3  .  ezetimibe (ZETIA) 10 MG tablet Take 1 tablet (10 mg total) by mouth daily. 90 tablet 3  . fexofenadine (ALLEGRA) 180 MG tablet Take 180 mg by mouth daily as needed.      . fish oil-omega-3 fatty acids 1000 MG capsule Take 3 g by mouth 2 (two) times daily.      . metoprolol (LOPRESSOR) 50 MG tablet TAKE 1/2 TABLET BY MOUTH TWICE A DAY 90 tablet 3  . nitroGLYCERIN (NITROSTAT) 0.4 MG SL tablet Place 1 tablet (0.4 mg total) under the tongue every 5 (five) minutes as needed for chest pain (CP or SOB). 25 tablet 3  . omeprazole (PRILOSEC) 20 MG capsule Take 20 mg by mouth daily.    Marland Kitchen spironolactone-hydrochlorothiazide (ALDACTAZIDE) 25-25 MG per tablet Take 0.5 tablets by mouth daily. 45 tablet 3   No current facility-administered medications for this visit.    Allergies  Codeine; Nabumetone; Statins; and Zolpidem tartrate  Electrocardiogram:  6/14  SR rate 69  Normal  12/20/14  NSR rate 69 normal no change   Assessment and Plan

## 2014-12-20 ENCOUNTER — Ambulatory Visit (INDEPENDENT_AMBULATORY_CARE_PROVIDER_SITE_OTHER): Payer: Medicare Other | Admitting: Cardiovascular Disease

## 2014-12-20 VITALS — HR 69 | Ht 72.0 in | Wt 216.4 lb

## 2014-12-20 DIAGNOSIS — I25700 Atherosclerosis of coronary artery bypass graft(s), unspecified, with unstable angina pectoris: Secondary | ICD-10-CM

## 2014-12-20 DIAGNOSIS — I10 Essential (primary) hypertension: Secondary | ICD-10-CM | POA: Diagnosis not present

## 2014-12-20 DIAGNOSIS — E785 Hyperlipidemia, unspecified: Secondary | ICD-10-CM | POA: Diagnosis not present

## 2014-12-20 NOTE — Assessment & Plan Note (Signed)
Will get lab results from primary Intolerant to statins.  Zetia too expensive  Refer to lipid clinic to see if he can get in trial or help  Payment with [employer]

## 2014-12-20 NOTE — Assessment & Plan Note (Signed)
Well controlled.  Continue current medications and low sodium Dash type diet.    

## 2014-12-20 NOTE — Assessment & Plan Note (Signed)
Stable with no angina and good activity level.  Continue medical Rx  

## 2014-12-20 NOTE — Patient Instructions (Signed)
Your physician wants you to follow-up in:   [place]  WITH LIPID  CLINIC  INTOLERANT  TO  STATINS  AND  UNABLE TO AFFORD  ZETIA You will receive a reminder letter in the mail two months in advance. If you don't receive a letter, please call our office to schedule the follow-up appointment.

## 2014-12-27 ENCOUNTER — Other Ambulatory Visit: Payer: Self-pay | Admitting: Cardiovascular Disease

## 2014-12-27 ENCOUNTER — Ambulatory Visit (INDEPENDENT_AMBULATORY_CARE_PROVIDER_SITE_OTHER): Payer: Medicare Other | Admitting: Pharmacist

## 2014-12-27 DIAGNOSIS — E785 Hyperlipidemia, unspecified: Secondary | ICD-10-CM

## 2014-12-27 DIAGNOSIS — I25700 Atherosclerosis of coronary artery bypass graft(s), unspecified, with unstable angina pectoris: Secondary | ICD-10-CM | POA: Diagnosis not present

## 2014-12-27 NOTE — Progress Notes (Signed)
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 Johnsie Cancel for lipid management. Past medical history is significant for CAD s/p CABG x 1 in 2002, hypertension, dyslipidemia and prostate cancer s/p prostatectomy. PCP Dr. Nelda Bucks.   Patient has a history of statin intolerance including attempted use of Crestor 10 mg which caused severe hand and leg cramping. At the time, Crestor was changed to every other day dosing and CoQ10 was also attempted with no improvement in myalgias. He has also attempted use with Lipitor but patient felt that this was causing severe chest pain upon waking. He is currently taking fish oil and Zetia 10 mg daily but has concerns with cost (copay $431/90 day supply).    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 Codeine Nausea And Vomiting  . Nabumetone     "FELT LIKE I WAS GOING TO PASS OUT"  . Statins     CRAMPS, "FELT LIKE I WAS HAVING A HEART ATTACK  . Zolpidem Tartrate     "DONT KNOW NOTHING"   Current Outpatient Prescriptions  Medication Sig Dispense Refill  . aspirin 81 MG tablet Take 81 mg by mouth daily.     . Cholecalciferol (VITAMIN D-3) 1000 UNITS CAPS Take 1 capsule by mouth daily.    . Coenzyme Q10 (COQ-10 PO) Take 1 tablet by mouth daily.    . enalapril (VASOTEC) 20 MG tablet Take 0.5-1 tablets (10-20 mg total) by mouth 2 (two) times daily. 10 mg in am and 20 mg in pm 135 tablet 3  . ezetimibe (ZETIA) 10 MG tablet Take 1 tablet (10 mg total) by mouth daily. 90  tablet 3  . fish oil-omega-3 fatty acids 1000 MG capsule Take 3 g by mouth 2 (two) times daily.      Marland Kitchen ibuprofen (ADVIL,MOTRIN) 800 MG tablet Take 800 mg by mouth every 8 (eight) hours as needed for moderate pain.    . metoprolol (LOPRESSOR) 50 MG tablet TAKE 1/2 TABLET BY MOUTH TWICE A DAY 90 tablet 3  . nitroGLYCERIN (NITROSTAT) 0.4 MG SL tablet Place 1 tablet (0.4 mg total) under the tongue every 5 (five) minutes as needed for chest pain (CP or SOB). 25 tablet 3  . omeprazole (PRILOSEC) 20 MG capsule Take 20 mg by mouth daily.    Marland Kitchen spironolactone-hydrochlorothiazide (ALDACTAZIDE) 25-25 MG per tablet Take 0.5 tablets by mouth daily. 45 tablet 3   No current facility-administered medications for this visit.   A/P:  Patient's goal LDL is <70 and non-HDL goal is <100. Will obtain a more recent lipid and LFT panel prior to considering change in current lipid medications. Encouraged patient to continue exercising and to continue Zetia for the time being. May consider switch/addition of pravastatin which the patient has not tried in past.    Stashia Sia K. Velva Harman, PharmD Clinical Pharmacist - Resident Pager: [phone] Pharmacy: [phone] 12/27/2014 3:35 PM

## 2015-02-02 DIAGNOSIS — S338XXA Sprain of other parts of lumbar spine and pelvis, initial encounter: Secondary | ICD-10-CM | POA: Diagnosis not present

## 2015-02-02 DIAGNOSIS — M9903 Segmental and somatic dysfunction of lumbar region: Secondary | ICD-10-CM | POA: Diagnosis not present

## 2015-02-06 DIAGNOSIS — S338XXA Sprain of other parts of lumbar spine and pelvis, initial encounter: Secondary | ICD-10-CM | POA: Diagnosis not present

## 2015-02-06 DIAGNOSIS — M9903 Segmental and somatic dysfunction of lumbar region: Secondary | ICD-10-CM | POA: Diagnosis not present

## 2015-02-08 DIAGNOSIS — M9903 Segmental and somatic dysfunction of lumbar region: Secondary | ICD-10-CM | POA: Diagnosis not present

## 2015-02-08 DIAGNOSIS — S338XXA Sprain of other parts of lumbar spine and pelvis, initial encounter: Secondary | ICD-10-CM | POA: Diagnosis not present

## 2015-02-13 DIAGNOSIS — M9903 Segmental and somatic dysfunction of lumbar region: Secondary | ICD-10-CM | POA: Diagnosis not present

## 2015-02-13 DIAGNOSIS — S338XXA Sprain of other parts of lumbar spine and pelvis, initial encounter: Secondary | ICD-10-CM | POA: Diagnosis not present

## 2015-02-16 DIAGNOSIS — S338XXA Sprain of other parts of lumbar spine and pelvis, initial encounter: Secondary | ICD-10-CM | POA: Diagnosis not present

## 2015-02-16 DIAGNOSIS — M9903 Segmental and somatic dysfunction of lumbar region: Secondary | ICD-10-CM | POA: Diagnosis not present

## 2015-02-20 ENCOUNTER — Other Ambulatory Visit: Payer: Self-pay | Admitting: Cardiovascular Disease

## 2015-02-21 DIAGNOSIS — M9903 Segmental and somatic dysfunction of lumbar region: Secondary | ICD-10-CM | POA: Diagnosis not present

## 2015-02-21 DIAGNOSIS — S338XXA Sprain of other parts of lumbar spine and pelvis, initial encounter: Secondary | ICD-10-CM | POA: Diagnosis not present

## 2015-02-27 DIAGNOSIS — C61 Malignant neoplasm of prostate: Secondary | ICD-10-CM | POA: Diagnosis not present

## 2015-02-27 DIAGNOSIS — Z87898 Personal history of other specified conditions: Secondary | ICD-10-CM | POA: Diagnosis not present

## 2015-03-07 DIAGNOSIS — S338XXA Sprain of other parts of lumbar spine and pelvis, initial encounter: Secondary | ICD-10-CM | POA: Diagnosis not present

## 2015-03-07 DIAGNOSIS — M9903 Segmental and somatic dysfunction of lumbar region: Secondary | ICD-10-CM | POA: Diagnosis not present

## 2015-03-21 DIAGNOSIS — S338XXA Sprain of other parts of lumbar spine and pelvis, initial encounter: Secondary | ICD-10-CM | POA: Diagnosis not present

## 2015-03-21 DIAGNOSIS — M9903 Segmental and somatic dysfunction of lumbar region: Secondary | ICD-10-CM | POA: Diagnosis not present

## 2015-04-25 ENCOUNTER — Other Ambulatory Visit: Payer: Self-pay | Admitting: Cardiovascular Disease

## 2015-05-15 ENCOUNTER — Other Ambulatory Visit: Payer: Self-pay | Admitting: Cardiovascular Disease

## 2015-05-20 DIAGNOSIS — Z9861 Coronary angioplasty status: Secondary | ICD-10-CM | POA: Diagnosis not present

## 2015-05-20 DIAGNOSIS — M79602 Pain in left arm: Secondary | ICD-10-CM | POA: Diagnosis not present

## 2015-05-20 DIAGNOSIS — Z7982 Long term (current) use of aspirin: Secondary | ICD-10-CM | POA: Diagnosis not present

## 2015-05-20 DIAGNOSIS — R001 Bradycardia, unspecified: Secondary | ICD-10-CM | POA: Diagnosis not present

## 2015-05-20 DIAGNOSIS — I25810 Atherosclerosis of coronary artery bypass graft(s) without angina pectoris: Secondary | ICD-10-CM | POA: Diagnosis not present

## 2015-05-20 DIAGNOSIS — Z23 Encounter for immunization: Secondary | ICD-10-CM | POA: Diagnosis not present

## 2015-05-20 DIAGNOSIS — F419 Anxiety disorder, unspecified: Secondary | ICD-10-CM | POA: Diagnosis not present

## 2015-05-20 DIAGNOSIS — I252 Old myocardial infarction: Secondary | ICD-10-CM | POA: Diagnosis not present

## 2015-05-20 DIAGNOSIS — Z8546 Personal history of malignant neoplasm of prostate: Secondary | ICD-10-CM | POA: Diagnosis not present

## 2015-05-20 DIAGNOSIS — K824 Cholesterolosis of gallbladder: Secondary | ICD-10-CM | POA: Diagnosis not present

## 2015-05-20 DIAGNOSIS — I249 Acute ischemic heart disease, unspecified: Secondary | ICD-10-CM | POA: Diagnosis not present

## 2015-05-20 DIAGNOSIS — Z79899 Other long term (current) drug therapy: Secondary | ICD-10-CM | POA: Diagnosis not present

## 2015-05-20 DIAGNOSIS — Z951 Presence of aortocoronary bypass graft: Secondary | ICD-10-CM | POA: Diagnosis not present

## 2015-05-20 DIAGNOSIS — M1990 Unspecified osteoarthritis, unspecified site: Secondary | ICD-10-CM | POA: Diagnosis not present

## 2015-05-20 DIAGNOSIS — E782 Mixed hyperlipidemia: Secondary | ICD-10-CM | POA: Diagnosis not present

## 2015-05-20 DIAGNOSIS — M79632 Pain in left forearm: Secondary | ICD-10-CM | POA: Diagnosis not present

## 2015-05-20 DIAGNOSIS — R079 Chest pain, unspecified: Secondary | ICD-10-CM | POA: Diagnosis not present

## 2015-05-20 DIAGNOSIS — Z87891 Personal history of nicotine dependence: Secondary | ICD-10-CM | POA: Diagnosis not present

## 2015-05-20 DIAGNOSIS — Z885 Allergy status to narcotic agent status: Secondary | ICD-10-CM | POA: Diagnosis not present

## 2015-05-20 DIAGNOSIS — Z888 Allergy status to other drugs, medicaments and biological substances status: Secondary | ICD-10-CM | POA: Diagnosis not present

## 2015-05-20 DIAGNOSIS — I10 Essential (primary) hypertension: Secondary | ICD-10-CM | POA: Diagnosis not present

## 2015-05-20 DIAGNOSIS — K7689 Other specified diseases of liver: Secondary | ICD-10-CM | POA: Diagnosis not present

## 2015-05-21 DIAGNOSIS — I10 Essential (primary) hypertension: Secondary | ICD-10-CM | POA: Diagnosis not present

## 2015-05-21 DIAGNOSIS — R0789 Other chest pain: Secondary | ICD-10-CM | POA: Diagnosis not present

## 2015-05-21 DIAGNOSIS — I25810 Atherosclerosis of coronary artery bypass graft(s) without angina pectoris: Secondary | ICD-10-CM | POA: Diagnosis not present

## 2015-05-21 DIAGNOSIS — I249 Acute ischemic heart disease, unspecified: Secondary | ICD-10-CM | POA: Diagnosis not present

## 2015-05-21 DIAGNOSIS — M79602 Pain in left arm: Secondary | ICD-10-CM | POA: Diagnosis not present

## 2015-05-21 DIAGNOSIS — R079 Chest pain, unspecified: Secondary | ICD-10-CM | POA: Diagnosis not present

## 2015-05-21 DIAGNOSIS — I252 Old myocardial infarction: Secondary | ICD-10-CM | POA: Diagnosis not present

## 2015-05-21 DIAGNOSIS — M79632 Pain in left forearm: Secondary | ICD-10-CM | POA: Diagnosis not present

## 2015-05-21 DIAGNOSIS — E782 Mixed hyperlipidemia: Secondary | ICD-10-CM | POA: Diagnosis not present

## 2015-05-24 DIAGNOSIS — R1011 Right upper quadrant pain: Secondary | ICD-10-CM | POA: Diagnosis not present

## 2015-05-24 DIAGNOSIS — M4722 Other spondylosis with radiculopathy, cervical region: Secondary | ICD-10-CM | POA: Diagnosis not present

## 2015-05-24 DIAGNOSIS — M4802 Spinal stenosis, cervical region: Secondary | ICD-10-CM | POA: Diagnosis not present

## 2015-05-24 DIAGNOSIS — I25709 Atherosclerosis of coronary artery bypass graft(s), unspecified, with unspecified angina pectoris: Secondary | ICD-10-CM | POA: Diagnosis not present

## 2015-05-24 DIAGNOSIS — K824 Cholesterolosis of gallbladder: Secondary | ICD-10-CM | POA: Diagnosis not present

## 2015-05-24 DIAGNOSIS — M47812 Spondylosis without myelopathy or radiculopathy, cervical region: Secondary | ICD-10-CM | POA: Diagnosis not present

## 2015-05-24 DIAGNOSIS — Z6830 Body mass index (BMI) 30.0-30.9, adult: Secondary | ICD-10-CM | POA: Diagnosis not present

## 2015-05-24 DIAGNOSIS — K219 Gastro-esophageal reflux disease without esophagitis: Secondary | ICD-10-CM | POA: Diagnosis not present

## 2015-05-29 ENCOUNTER — Other Ambulatory Visit: Payer: Self-pay | Admitting: Cardiovascular Disease

## 2015-05-30 ENCOUNTER — Other Ambulatory Visit: Payer: Self-pay | Admitting: Cardiovascular Disease

## 2015-05-30 ENCOUNTER — Other Ambulatory Visit: Payer: Self-pay

## 2015-05-30 MED ORDER — METOPROLOL TARTRATE 50 MG PO TABS: 25.0000 mg | ORAL_TABLET | Freq: Two times a day (BID) | ORAL | Status: DC

## 2015-06-05 DIAGNOSIS — R972 Elevated prostate specific antigen [PSA]: Secondary | ICD-10-CM | POA: Diagnosis not present

## 2015-06-05 DIAGNOSIS — Z87898 Personal history of other specified conditions: Secondary | ICD-10-CM | POA: Diagnosis not present

## 2015-06-06 DIAGNOSIS — K828 Other specified diseases of gallbladder: Secondary | ICD-10-CM | POA: Diagnosis not present

## 2015-06-06 DIAGNOSIS — R1013 Epigastric pain: Secondary | ICD-10-CM | POA: Diagnosis not present

## 2015-06-06 DIAGNOSIS — K829 Disease of gallbladder, unspecified: Secondary | ICD-10-CM | POA: Diagnosis not present

## 2015-06-08 DIAGNOSIS — R110 Nausea: Secondary | ICD-10-CM | POA: Diagnosis not present

## 2015-06-08 DIAGNOSIS — K811 Chronic cholecystitis: Secondary | ICD-10-CM | POA: Diagnosis not present

## 2015-06-08 DIAGNOSIS — R1011 Right upper quadrant pain: Secondary | ICD-10-CM | POA: Diagnosis not present

## 2015-06-08 DIAGNOSIS — R1013 Epigastric pain: Secondary | ICD-10-CM | POA: Diagnosis not present

## 2015-06-15 DIAGNOSIS — Z8546 Personal history of malignant neoplasm of prostate: Secondary | ICD-10-CM | POA: Diagnosis not present

## 2015-06-15 DIAGNOSIS — R972 Elevated prostate specific antigen [PSA]: Secondary | ICD-10-CM | POA: Diagnosis not present

## 2015-06-16 DIAGNOSIS — K8010 Calculus of gallbladder with chronic cholecystitis without obstruction: Secondary | ICD-10-CM | POA: Diagnosis not present

## 2015-06-16 DIAGNOSIS — K219 Gastro-esophageal reflux disease without esophagitis: Secondary | ICD-10-CM | POA: Diagnosis not present

## 2015-06-16 DIAGNOSIS — Z9981 Dependence on supplemental oxygen: Secondary | ICD-10-CM | POA: Diagnosis not present

## 2015-06-16 DIAGNOSIS — Z955 Presence of coronary angioplasty implant and graft: Secondary | ICD-10-CM | POA: Diagnosis not present

## 2015-06-16 DIAGNOSIS — I10 Essential (primary) hypertension: Secondary | ICD-10-CM | POA: Diagnosis not present

## 2015-06-16 DIAGNOSIS — Z79899 Other long term (current) drug therapy: Secondary | ICD-10-CM | POA: Diagnosis not present

## 2015-06-16 DIAGNOSIS — K7581 Nonalcoholic steatohepatitis (NASH): Secondary | ICD-10-CM | POA: Diagnosis not present

## 2015-06-16 DIAGNOSIS — I2510 Atherosclerotic heart disease of native coronary artery without angina pectoris: Secondary | ICD-10-CM | POA: Diagnosis not present

## 2015-06-16 DIAGNOSIS — I252 Old myocardial infarction: Secondary | ICD-10-CM | POA: Diagnosis not present

## 2015-06-16 DIAGNOSIS — G4733 Obstructive sleep apnea (adult) (pediatric): Secondary | ICD-10-CM | POA: Diagnosis not present

## 2015-06-16 DIAGNOSIS — K8020 Calculus of gallbladder without cholecystitis without obstruction: Secondary | ICD-10-CM | POA: Diagnosis not present

## 2015-06-16 DIAGNOSIS — E785 Hyperlipidemia, unspecified: Secondary | ICD-10-CM | POA: Diagnosis not present

## 2015-06-19 DIAGNOSIS — H6241 Otitis externa in other diseases classified elsewhere, right ear: Secondary | ICD-10-CM | POA: Diagnosis not present

## 2015-06-19 DIAGNOSIS — H903 Sensorineural hearing loss, bilateral: Secondary | ICD-10-CM | POA: Diagnosis not present

## 2015-06-27 ENCOUNTER — Other Ambulatory Visit: Payer: Self-pay | Admitting: Cardiovascular Disease

## 2015-07-04 DIAGNOSIS — R110 Nausea: Secondary | ICD-10-CM | POA: Diagnosis not present

## 2015-07-06 DIAGNOSIS — K5730 Diverticulosis of large intestine without perforation or abscess without bleeding: Secondary | ICD-10-CM | POA: Diagnosis not present

## 2015-07-06 DIAGNOSIS — K7689 Other specified diseases of liver: Secondary | ICD-10-CM | POA: Diagnosis not present

## 2015-07-06 DIAGNOSIS — R110 Nausea: Secondary | ICD-10-CM | POA: Diagnosis not present

## 2015-07-06 DIAGNOSIS — K760 Fatty (change of) liver, not elsewhere classified: Secondary | ICD-10-CM | POA: Diagnosis not present

## 2015-07-06 DIAGNOSIS — Z8546 Personal history of malignant neoplasm of prostate: Secondary | ICD-10-CM | POA: Diagnosis not present

## 2015-07-17 DIAGNOSIS — K5730 Diverticulosis of large intestine without perforation or abscess without bleeding: Secondary | ICD-10-CM | POA: Diagnosis not present

## 2015-07-17 DIAGNOSIS — R112 Nausea with vomiting, unspecified: Secondary | ICD-10-CM | POA: Diagnosis not present

## 2015-07-17 DIAGNOSIS — K219 Gastro-esophageal reflux disease without esophagitis: Secondary | ICD-10-CM | POA: Diagnosis not present

## 2015-07-17 DIAGNOSIS — R945 Abnormal results of liver function studies: Secondary | ICD-10-CM | POA: Diagnosis not present

## 2015-07-17 DIAGNOSIS — R935 Abnormal findings on diagnostic imaging of other abdominal regions, including retroperitoneum: Secondary | ICD-10-CM | POA: Diagnosis not present

## 2015-07-17 DIAGNOSIS — R7989 Other specified abnormal findings of blood chemistry: Secondary | ICD-10-CM | POA: Diagnosis not present

## 2015-07-21 DIAGNOSIS — J019 Acute sinusitis, unspecified: Secondary | ICD-10-CM | POA: Diagnosis not present

## 2015-07-21 DIAGNOSIS — Z6830 Body mass index (BMI) 30.0-30.9, adult: Secondary | ICD-10-CM | POA: Diagnosis not present

## 2015-07-21 DIAGNOSIS — J208 Acute bronchitis due to other specified organisms: Secondary | ICD-10-CM | POA: Diagnosis not present

## 2015-07-28 ENCOUNTER — Other Ambulatory Visit: Payer: Self-pay | Admitting: Cardiovascular Disease

## 2015-08-17 DIAGNOSIS — J208 Acute bronchitis due to other specified organisms: Secondary | ICD-10-CM | POA: Diagnosis not present

## 2015-08-17 DIAGNOSIS — R945 Abnormal results of liver function studies: Secondary | ICD-10-CM | POA: Diagnosis not present

## 2015-09-11 DIAGNOSIS — K5730 Diverticulosis of large intestine without perforation or abscess without bleeding: Secondary | ICD-10-CM | POA: Diagnosis not present

## 2015-09-11 DIAGNOSIS — K7581 Nonalcoholic steatohepatitis (NASH): Secondary | ICD-10-CM | POA: Diagnosis not present

## 2015-09-11 DIAGNOSIS — R945 Abnormal results of liver function studies: Secondary | ICD-10-CM | POA: Diagnosis not present

## 2015-09-27 ENCOUNTER — Other Ambulatory Visit: Payer: Self-pay | Admitting: Cardiovascular Disease

## 2015-09-28 ENCOUNTER — Other Ambulatory Visit: Payer: Self-pay | Admitting: Cardiovascular Disease

## 2015-09-28 NOTE — Telephone Encounter (Signed)
°*  STAT* If patient is at the pharmacy, call can be transferred to refill team.   1. Which medications need to be refilled? (please list name of each medication and dose if known)  METOPROLOL  50MG   2. Which pharmacy/location (including street and city if local pharmacy) is medication to be sent to?  [facility] in [place]  3. Do they need a 30 day or 90 day supply? [place]

## 2015-10-06 DIAGNOSIS — Z6830 Body mass index (BMI) 30.0-30.9, adult: Secondary | ICD-10-CM | POA: Diagnosis not present

## 2015-10-06 DIAGNOSIS — K5792 Diverticulitis of intestine, part unspecified, without perforation or abscess without bleeding: Secondary | ICD-10-CM | POA: Diagnosis not present

## 2015-10-20 ENCOUNTER — Other Ambulatory Visit: Payer: Self-pay | Admitting: Cardiovascular Disease

## 2015-11-06 DIAGNOSIS — Z9079 Acquired absence of other genital organ(s): Secondary | ICD-10-CM | POA: Diagnosis not present

## 2015-11-06 DIAGNOSIS — K5730 Diverticulosis of large intestine without perforation or abscess without bleeding: Secondary | ICD-10-CM | POA: Diagnosis not present

## 2015-11-06 DIAGNOSIS — Z6830 Body mass index (BMI) 30.0-30.9, adult: Secondary | ICD-10-CM | POA: Diagnosis not present

## 2015-11-06 DIAGNOSIS — E669 Obesity, unspecified: Secondary | ICD-10-CM | POA: Diagnosis not present

## 2015-11-06 DIAGNOSIS — R1031 Right lower quadrant pain: Secondary | ICD-10-CM | POA: Diagnosis not present

## 2015-11-06 DIAGNOSIS — M4317 Spondylolisthesis, lumbosacral region: Secondary | ICD-10-CM | POA: Diagnosis not present

## 2015-11-28 DIAGNOSIS — J208 Acute bronchitis due to other specified organisms: Secondary | ICD-10-CM | POA: Diagnosis not present

## 2015-11-28 DIAGNOSIS — Z6830 Body mass index (BMI) 30.0-30.9, adult: Secondary | ICD-10-CM | POA: Diagnosis not present

## 2015-12-21 ENCOUNTER — Other Ambulatory Visit: Payer: Self-pay | Admitting: Cardiovascular Disease

## 2015-12-23 ENCOUNTER — Other Ambulatory Visit: Payer: Self-pay | Admitting: Cardiovascular Disease

## 2016-01-09 DIAGNOSIS — N5231 Erectile dysfunction following radical prostatectomy: Secondary | ICD-10-CM | POA: Diagnosis not present

## 2016-01-09 DIAGNOSIS — C61 Malignant neoplasm of prostate: Secondary | ICD-10-CM | POA: Diagnosis not present

## 2016-01-09 DIAGNOSIS — R972 Elevated prostate specific antigen [PSA]: Secondary | ICD-10-CM | POA: Diagnosis not present

## 2016-01-15 ENCOUNTER — Emergency Department (HOSPITAL_COMMUNITY): Payer: Medicare Other

## 2016-01-15 ENCOUNTER — Other Ambulatory Visit: Payer: Self-pay | Admitting: Cardiovascular Disease

## 2016-01-15 ENCOUNTER — Encounter (HOSPITAL_COMMUNITY): Payer: Self-pay | Admitting: Emergency Medicine

## 2016-01-15 ENCOUNTER — Emergency Department (HOSPITAL_COMMUNITY)
Admission: EM | Admit: 2016-01-15 | Discharge: 2016-01-15 | Disposition: A | Discharge status: Home / Self Care | Payer: Medicare Other | Attending: Emergency Medicine | Admitting: Emergency Medicine

## 2016-01-15 DIAGNOSIS — I10 Essential (primary) hypertension: Secondary | ICD-10-CM | POA: Diagnosis not present

## 2016-01-15 DIAGNOSIS — Z87891 Personal history of nicotine dependence: Secondary | ICD-10-CM | POA: Insufficient documentation

## 2016-01-15 DIAGNOSIS — I2510 Atherosclerotic heart disease of native coronary artery without angina pectoris: Secondary | ICD-10-CM | POA: Diagnosis not present

## 2016-01-15 DIAGNOSIS — R05 Cough: Secondary | ICD-10-CM | POA: Diagnosis not present

## 2016-01-15 DIAGNOSIS — Z7982 Long term (current) use of aspirin: Secondary | ICD-10-CM | POA: Diagnosis not present

## 2016-01-15 DIAGNOSIS — Z9861 Coronary angioplasty status: Secondary | ICD-10-CM | POA: Insufficient documentation

## 2016-01-15 DIAGNOSIS — Z79899 Other long term (current) drug therapy: Secondary | ICD-10-CM | POA: Diagnosis not present

## 2016-01-15 DIAGNOSIS — R079 Chest pain, unspecified: Secondary | ICD-10-CM | POA: Diagnosis not present

## 2016-01-15 DIAGNOSIS — Z9889 Other specified postprocedural states: Secondary | ICD-10-CM | POA: Insufficient documentation

## 2016-01-15 DIAGNOSIS — E785 Hyperlipidemia, unspecified: Secondary | ICD-10-CM | POA: Diagnosis not present

## 2016-01-15 DIAGNOSIS — Z8546 Personal history of malignant neoplasm of prostate: Secondary | ICD-10-CM | POA: Diagnosis not present

## 2016-01-15 DIAGNOSIS — Z951 Presence of aortocoronary bypass graft: Secondary | ICD-10-CM | POA: Insufficient documentation

## 2016-01-15 DIAGNOSIS — K219 Gastro-esophageal reflux disease without esophagitis: Secondary | ICD-10-CM | POA: Diagnosis not present

## 2016-01-15 DIAGNOSIS — Z8669 Personal history of other diseases of the nervous system and sense organs: Secondary | ICD-10-CM | POA: Diagnosis not present

## 2016-01-15 LAB — BASIC METABOLIC PANEL
ANION GAP: 13 (ref 5–15)
BUN: 17 mg/dL (ref 6–20)
CALCIUM: 9.8 mg/dL (ref 8.9–10.3)
CHLORIDE: 104 mmol/L (ref 101–111)
CO2: 23 mmol/L (ref 22–32)
CREATININE: 0.93 mg/dL (ref 0.61–1.24)
GFR calc non Af Amer: >60 mL/min (ref >60)
Glucose, Bld: 110 mg/dL — ABNORMAL HIGH (ref 65–99)
Potassium: 4.6 mmol/L (ref 3.5–5.1)
SODIUM: 140 mmol/L (ref 135–145)

## 2016-01-15 LAB — I-STAT TROPONIN, ED
TROPONIN I, POC: 0 ng/mL (ref 0.00–0.08)
TROPONIN I, POC: 0 ng/mL (ref 0.00–0.08)

## 2016-01-15 LAB — CBC
HCT: 45.8 % (ref 39.0–52.0)
HEMOGLOBIN: 15.3 g/dL (ref 13.0–17.0)
MCH: 31.7 pg (ref 26.0–34.0)
MCHC: 33.4 g/dL (ref 30.0–36.0)
MCV: 94.8 fL (ref 78.0–100.0)
PLATELETS: 264 10*3/uL (ref 150–400)
RBC: 4.83 MIL/uL (ref 4.22–5.81)
RDW: 13.3 % (ref 11.5–15.5)
WBC: 12.7 10*3/uL — AB (ref 4.0–10.5)

## 2016-01-15 MED ORDER — METOPROLOL TARTRATE 1 MG/ML IV SOLN
5.0000 mg | Freq: Once | INTRAVENOUS | Status: AC
  Administered 2016-01-15: 5 mg via INTRAVENOUS

## 2016-01-15 MED ORDER — ASPIRIN 81 MG PO CHEW
324.0000 mg | CHEWABLE_TABLET | Freq: Once | ORAL | Status: AC
  Administered 2016-01-15: 324 mg via ORAL
  Filled 2016-01-15: qty 4

## 2016-01-15 MED ORDER — IOHEXOL 350 MG/ML SOLN
80.0000 mL | Freq: Once | INTRAVENOUS | Status: AC | PRN
  Administered 2016-01-15: 80 mL via INTRAVENOUS

## 2016-01-15 MED ORDER — METOPROLOL TARTRATE 1 MG/ML IV SOLN
INTRAVENOUS | Status: AC
  Filled 2016-01-15: qty 5

## 2016-01-15 MED ORDER — NITROGLYCERIN 0.4 MG SL SUBL
SUBLINGUAL_TABLET | SUBLINGUAL | Status: AC
  Filled 2016-01-15: qty 2

## 2016-01-15 MED ORDER — NAPROXEN 500 MG PO TABS: 500.0000 mg | ORAL_TABLET | Freq: Two times a day (BID) | ORAL | Status: DC

## 2016-01-15 MED ORDER — NITROGLYCERIN 0.4 MG SL SUBL
0.8000 mg | SUBLINGUAL_TABLET | Freq: Once | SUBLINGUAL | Status: AC
  Administered 2016-01-15: 0.8 mg via SUBLINGUAL

## 2016-01-15 NOTE — ED Provider Notes (Signed)
CSN: [id number]     Arrival date & time 01/15/16  0204 History   First MD Initiated Contact with Patient 01/15/16 0602     Chief Complaint  Patient presents with  . Chest Pain     (Consider location/radiation/quality/duration/timing/severity/associated sxs/prior Treatment) HPI Comments: Patient with a history of CABG in 2002 presents today with a chief complaint of chest pain.  He reports that the pain has been intermittent since yesterday morning.  Pain located left anterior chest and associated with some tingling of the left hand.  He states that the pain lasts anywhere from a few minutes to one hour.  Pain comes on both at rest and with exertion.  He denies any chest pain at this time.  He reports that the pain is associated with SOB, nausea, and lightheadedness.  He also reports an associated cough.  He denies syncope, hemoptysis, fever, chills, or LE edema.  His Cardiologist is Dr. Johnsie Cancel.    The history is provided by the patient.    Past Medical History  Diagnosis Date  . CAD (coronary artery disease)     a. s/p CABG x 1 (LIMA->LAD);  b. 04/2013 Ex MV: Ex time 71mins, no isch/infarct, EF 60%.  Marland Kitchen HTN (hypertension)   . Hyperlipidemia     intolerant of all statins due to muscle aches  . Prostate cancer [facility])     s/p resection  . OSA (obstructive sleep apnea)     resolved with weight loss  . S/P colonoscopy with polypectomy   . Acid reflux    Past Surgical History  Procedure Laterality Date  . Coronary artery bypass graft  2002  . Total hip arthroplasty Bilateral   . Coronary stent placement     Family History  Problem Relation Age of Onset  . Diabetes Father   . Coronary artery disease Father   . Hypertension Father   . Cancer Sister   . Arthritis Mother    Social History  Substance Use Topics  . Smoking status: Former Smoker    Quit date: 11/26/1987  . Smokeless tobacco: None  . Alcohol Use: Yes     Comment: occasionally    Review of Systems  All other  systems reviewed and are negative.     Allergies  Codeine; Nabumetone; Statins; and Zolpidem tartrate  Home Medications   Prior to Admission medications   Medication Sig Start Date End Date Taking? Authorizing Provider  aspirin 81 MG tablet Take 81 mg by mouth daily.     Historical Provider, MD  Cholecalciferol (VITAMIN D-3) 1000 UNITS CAPS Take 1 capsule by mouth daily.    Historical Provider, MD  Coenzyme Q10 (COQ-10 PO) Take 1 tablet by mouth daily.    Historical Provider, MD  enalapril (VASOTEC) 20 MG tablet TAKE 1/2 TABLET BY MOUTH EVERY MORNING AND 1 TABLET BY MOUTH EVERY EVENING 05/16/15   Josue Hector, MD  fish oil-omega-3 fatty acids 1000 MG capsule Take 3 g by mouth 2 (two) times daily.      Historical Provider, MD  ibuprofen (ADVIL,MOTRIN) 800 MG tablet Take 800 mg by mouth every 8 (eight) hours as needed for moderate pain.    Historical Provider, MD  metoprolol (LOPRESSOR) 50 MG tablet TAKE 1/2 TABLET BY MOUTH TWICE DAILY 12/21/15   Josue Hector, MD  nitroGLYCERIN (NITROSTAT) 0.4 MG SL tablet Place 1 tablet (0.4 mg total) under the tongue every 5 (five) minutes as needed for chest pain (CP or SOB). 04/27/13  Rogelia Mire, NP  omeprazole (PRILOSEC) 20 MG capsule Take 20 mg by mouth daily.    Historical Provider, MD  spironolactone-hydrochlorothiazide (ALDACTAZIDE) 25-25 MG tablet TAKE 1/2 TABLET BY MOUTH EVERY DAY 12/25/15   Josue Hector, MD  ZETIA 10 MG tablet TAKE 1 TABLET BY MOUTH EVERY DAY 07/28/15   Josue Hector, MD   BP 123/89 mmHg  Pulse 71  Temp(Src) 98.8 F (37.1 C) (Oral)  Resp 18  SpO2 95% Physical Exam  Constitutional: He appears well-developed and well-nourished.  HENT:  Head: Normocephalic and atraumatic.  Mouth/Throat: Oropharynx is clear and moist.  Neck: Normal range of motion. Neck supple.  Cardiovascular: Normal rate, regular rhythm and normal heart sounds.   Pulses:      Dorsalis pedis pulses are 2+ on the right side, and 2+ on the left  side.  Pulmonary/Chest: Effort normal and breath sounds normal.  Abdominal: Soft. He exhibits no distension and no mass. There is no tenderness. There is no rebound and no guarding.  Musculoskeletal: Normal range of motion.  Neurological: He is alert.  Skin: Skin is warm and dry.  Psychiatric: He has a normal mood and affect.  Nursing note and vitals reviewed.   ED Course  Procedures (including critical care time) Labs Review Labs Reviewed  BASIC METABOLIC PANEL - Abnormal; Notable for the following:    Glucose, Bld 110 (*)    All other components within normal limits  CBC - Abnormal; Notable for the following:    WBC 12.7 (*)    All other components within normal limits  I-STAT TROPOININ, ED  Randolm Idol, ED    Imaging Review Dg Chest 2 View  01/15/2016  CLINICAL DATA:  Left-sided chest pain and cough for 1 day. EXAM: CHEST  2 VIEW COMPARISON:  05/20/2015 FINDINGS: Patient is post median sternotomy. The cardiomediastinal contours are unchanged. The lungs are clear. Pulmonary vasculature is normal. No consolidation, pleural effusion, or pneumothorax. No acute osseous abnormalities are seen. IMPRESSION: No acute pulmonary process. Electronically Signed   By: Jeb Levering M.D.   On: 01/15/2016 03:29   Ct Coronary Morp W/cta Cor W/score W/ca W/cm &/or Wo/cm  01/15/2016  ADDENDUM REPORT: 01/15/2016 10:14 CLINICAL DATA:  Chest pain EXAM: Cardiac CTA MEDICATIONS: Sub lingual nitro. 4mg  and lopressor 5mg  TECHNIQUE: The patient was scanned on a Philips [id number] slice scanner. Gantry rotation speed was 270 msecs. Collimation was .26mm. A 100 kV prospective scan was triggered in the descending thoracic aorta at 111 HU's with 5% padding centered around 78% of the R-R interval. Average HR during the scan was 60 bpm. The 3D data set was interpreted on a dedicated work station using MPR, MIP and VRT modes. A total of 80cc of contrast was used. FINDINGS: Non-cardiac: See separate report from  [facility]. No significant findings on limited lung and soft tissue windows. Calcium Score: Coronary Arteries: Right dominant with no anomalies LM:  Less than 30 % distal calcified stenosis LAD: Occluded stent in proximal vessel LIMA patent to mid and distal LAD IM:  Normal D1:  30% calcified proximal stenosis Circumflex:  Small vessel OM1:  Cannot exclude severe distal calcified stenosis RCA:  30% calcified stenosis proximal mid and distal PDA:  30% calcified proximal PDA stenosis PLA:  Normal IMPRESSION: 1) Calcium score 1556 excluding stents in LAD 96th percentile for age and sex 2) Non-obstructive calcified disease in RCA, D1, IM. Cannot exclude calcified obstructive disease in distal small OM1 3) Occluded proximal LAD  at stent with patent LIMA to mid and distal LAD Medical Rx would seem most appropriate at this time especially if chest pain is atypical [name] Electronically Signed   By: [name] M.D.   On: 01/15/2016 10:14  01/15/2016  EXAM: OVER-READ INTERPRETATION  CT CHEST The following report is an over-read performed by radiologist Dr. Rebekah [facility] on 01/15/2016. This over-read does not include interpretation of cardiac or coronary anatomy or pathology. The coronary calcium score/coronary CTA interpretation by the cardiologist is attached. COMPARISON:  Chest CT 01/11/2009. FINDINGS: Within the visualized portions of the thorax there are no suspicious appearing pulmonary nodules or masses, there is no acute consolidative airspace disease, no pleural effusions, no pneumothorax and no lymphadenopathy. Diffuse low attenuation throughout the hepatic parenchyma, compatible with hepatic steatosis. Status post cholecystectomy. 3.1 cm low-attenuation lesion in segment 4A of the liver is compatible with a simple cyst. IMPRESSION: 1. Hepatic steatosis. 2. No acute incidental noncardiac findings noted. Electronically Signed: By: Vinnie Langton M.D. On: 01/15/2016  09:57   I have personally reviewed and evaluated these images and lab results as part of my medical decision-making.   EKG Interpretation None      MDM   Final diagnoses:  None   Patient with a history of CABG in 2002 presents today with a chief complaint of chest pain, which has been intermittent since yesterday morning.  He denies any chest pain at the time of my evaluation.  No ischemic changes on EKG.  Initial and delta troponin were negative.  CXR negative.  Cardiology consulted and evaluated the patient in the ED.  CT coronary arteries was ordered by Cardiology.  Results as above.  Cardiology reviewed the CT and determined patient was stable for discharge.  Patient discharged.  Strict return precautions given.    Hyman Bible, PA-C 01/15/16 1540  Lacretia Leigh, MD 01/17/16 [phone]

## 2016-01-15 NOTE — Consult Note (Signed)
CARDIOLOGY CONSULT NOTE   Patient ID: [name] MRN: [id number] DOB/AGE: 1946-06-23 70 y.o.  Admit date: 01/15/2016  Primary Physician   Nicoletta Dress, MD Primary Cardiologist   Dr. Johnsie Cancel Reason for Consultation   Chest pain Requesting Physician Dr. Stark Jock  HPI: [name] is a 70 y.o. male with a history of CAD status post CABG of hypertension, hyperlipidemia (intolerance to statin currently on zetia), sleep apnea and GERD who presented to [facility] ED for evaluation of chest pain.  Failed angioplasty 2002 with wire perforation and subsequent single vessel CABG- Dr Olevia Perches had done procedure And was attempting to open a totally occluded stent. No significant disease in RCA or circumflex. Hospitalization in 7/15 with chest pain R/O myovue normal.  He presented to [facility] ED for evaluation of chest pain. Intermittent left-sided sharp chest pain for the past 2 days, lasting minutes to hours. He did not try any sublingual nitroglycerin. Intermittently his blood pressure was minimally elevated during chest pain episodes. He also admits to having nausea. However this most likely due to acid reflux as patient also having burping sensation. The patient walks 15 minutes every day in the morning without any dyspnea or chest pain. Unable to differentiate if this pain is similar to prior cardiac pain or not. The patient lower extremity edema, palpitation, orthopnea, PND, syncope, vomiting, blood in his stool or urine. No claudication symptoms. The patient had a bronchitis about a month ago however no symptoms for many weeks. He is not taking his zetia for the past 2 weeks as he cannot afford it. No recent long distance travel.  Upon ED presentation his blood pressure was 121/101, most recent in 124/111. Lytes normal. Troponin negative. His x-ray clear. WBC of 12.7. EKG without acute abnormality.   Past Medical History  Diagnosis Date  . CAD (coronary artery  disease)     a. s/p CABG x 1 (LIMA->LAD);  b. 04/2013 Ex MV: Ex time 45mins, no isch/infarct, EF 60%.  Marland Kitchen HTN (hypertension)   . Hyperlipidemia     intolerant of all statins due to muscle aches  . Prostate cancer [facility])     s/p resection  . OSA (obstructive sleep apnea)     resolved with weight loss  . S/P colonoscopy with polypectomy   . Acid reflux      Past Surgical History  Procedure Laterality Date  . Coronary artery bypass graft  2002  . Total hip arthroplasty Bilateral   . Coronary stent placement      Allergies  Allergen Reactions  . Codeine Nausea And Vomiting  . Nabumetone     "FELT LIKE I WAS GOING TO PASS OUT"  . Statins     CRAMPS, "FELT LIKE I WAS HAVING A HEART ATTACK  . Zolpidem Tartrate     "DONT KNOW NOTHING"    I have reviewed the patient's current medications     Prior to Admission medications   Medication Sig Start Date End Date Taking? Authorizing Provider  aspirin 81 MG tablet Take 81 mg by mouth daily.    Yes Historical Provider, MD  Cholecalciferol (VITAMIN D-3) 1000 UNITS CAPS Take 1 capsule by mouth daily.   Yes Historical Provider, MD  Coenzyme Q10 (COQ-10 PO) Take 1 tablet by mouth daily.   Yes Historical Provider, MD  enalapril (VASOTEC) 20 MG tablet TAKE 1/2 TABLET BY MOUTH EVERY MORNING AND 1 TABLET BY MOUTH EVERY EVENING 05/16/15  Yes Josue Hector, MD  fexofenadine (ALLEGRA) 180 MG tablet Take 180 mg by mouth daily as needed for allergies or rhinitis.   Yes Historical Provider, MD  fish oil-omega-3 fatty acids 1000 MG capsule Take 2 g by mouth 2 (two) times daily.    Yes Historical Provider, MD  ibuprofen (ADVIL,MOTRIN) 800 MG tablet Take 800 mg by mouth every 8 (eight) hours as needed for moderate pain.   Yes Historical Provider, MD  metoprolol (LOPRESSOR) 50 MG tablet TAKE 1/2 TABLET BY MOUTH TWICE DAILY 12/21/15  Yes Josue Hector, MD  nitroGLYCERIN (NITROSTAT) 0.4 MG SL tablet Place 1 tablet (0.4 mg total) under the tongue every 5  (five) minutes as needed for chest pain (CP or SOB). 04/27/13  Yes Rogelia Mire, NP  omeprazole (PRILOSEC) 20 MG capsule Take 20 mg by mouth daily.   Yes Historical Provider, MD  spironolactone-hydrochlorothiazide (ALDACTAZIDE) 25-25 MG tablet TAKE 1/2 TABLET BY MOUTH EVERY DAY 12/25/15  Yes Josue Hector, MD  ZETIA 10 MG tablet TAKE 1 TABLET BY MOUTH EVERY DAY 07/28/15  Yes Josue Hector, MD     Social History   Social History  . Marital Status: Married    Spouse Name: N/A  . Number of Children: 4  . Years of Education: N/A   Occupational History  . truck driver    Social History Main Topics  . Smoking status: Former Smoker    Quit date: 11/26/1987  . Smokeless tobacco: Not on file  . Alcohol Use: Yes     Comment: occasionally  . Drug Use: No  . Sexual Activity: Not on file   Other Topics Concern  . Not on file   Social History Narrative    No family status information on file.   Family History  Problem Relation Age of Onset  . Diabetes Father   . Coronary artery disease Father   . Hypertension Father   . Cancer Sister   . Arthritis Mother       ROS:  Full 14 point review of systems complete and found to be negative unless listed above.  Physical Exam: Blood pressure 130/87, pulse 71, temperature 98.8 F (37.1 C), temperature source Oral, resp. rate 18, SpO2 90 %.  General: Well developed, well nourished, male in no acute distress Head: Eyes PERRLA, No xanthomas. Normocephalic and atraumatic, oropharynx without edema or exudate.  Lungs: Resp regular and unlabored, CTA. Heart: RRR no s3, s4, or murmurs..   Neck: No carotid bruits. No lymphadenopathy. No  JVD. Abdomen: Bowel sounds present, abdomen soft and non-tender without masses or hernias noted. Msk:  No spine or cva tenderness. No weakness, no joint deformities or effusions. Extremities: No clubbing, cyanosis or edema. DP/PT/Radials 2+ and equal bilaterally. Neuro: Alert and oriented X 3. No focal  deficits noted. Psych:  Good affect, responds appropriately Skin: No rashes or lesions noted.  Labs:   Lab Results  Component Value Date   WBC 12.7* 01/15/2016   HGB 15.3 01/15/2016   HCT 45.8 01/15/2016   MCV 94.8 01/15/2016   PLT 264 01/15/2016   No results for input(s): INR in the last 72 hours.  Recent Labs Lab 01/15/16 0221  NA 140  K 4.6  CL 104  CO2 23  BUN 17  CREATININE 0.93  CALCIUM 9.8  GLUCOSE 110*   No results found for: MG No results for input(s): CKTOTAL, CKMB, TROPONINI in the last 72 hours.  Recent Labs  01/15/16 0234 01/15/16 0624  TROPIPOC 0.00 0.00  No results found for: PROBNP Lab Results  Component Value Date   CHOL 149 04/29/2011   HDL 42.40 04/29/2011   LDLCALC 81 04/29/2011   TRIG 127.0 04/29/2011    ECG:  Vent. rate 76 BPM PR interval 190 ms QRS duration 86 ms QT/QTc 374/420 ms P-R-T axes 39 26 60 Radiology:  Dg Chest 2 View  01/15/2016  CLINICAL DATA:  Left-sided chest pain and cough for 1 day. EXAM: CHEST  2 VIEW COMPARISON:  05/20/2015 FINDINGS: Patient is post median sternotomy. The cardiomediastinal contours are unchanged. The lungs are clear. Pulmonary vasculature is normal. No consolidation, pleural effusion, or pneumothorax. No acute osseous abnormalities are seen. IMPRESSION: No acute pulmonary process. Electronically Signed   By: Jeb Levering M.D.   On: 01/15/2016 03:29    ASSESSMENT AND PLAN:     1. Chest pain -Atypical. Ongoing for the past 2 days intermittently. Has not tried any sublingual nitroglycerin. Associated with minimally elevated blood pressure as well. Differential include GERD versus musculoskeletal. - Troponin x 2 negative. EKG nonischemic.  2. HL - No results found for requested labs within last 365 days.  - Followed by primary care provider. - Intolerance to crestor and lipitor. Zetia (10mg )  too expensive (off for the past 2 weeks).  3. Hypertension - Minimally elevated. Mostly diastolic  blood pressure elevation in ED. - Continue enalapril 10mg  AM & 20mg  PM, metoprolol 25 MG twice a day, Spironolacton/hydrochlorothiazide 12.5/12.5 MG daily  4. CAD  - CABG x1 2002 - Normal Myoview 04/2014.  SignedLeanor Kail, [place] 01/15/2016, 8:09 AM Pager [phone]  Co-Sign MD

## 2016-01-15 NOTE — Progress Notes (Signed)
Cardiac CT shows no high grade coronary disease and a proximal LIMA - no extracardiac findings noted in the chest. Ruled-out for ACS by troponin. Based on these findings, ok for d/c home and follow-up with Dr. Johnsie Cancel. Consider anti-inflammatory pain medication course for left chest wall pain or topical NSAID/patch for symptoms.  Pixie Casino, MD, [facility] Attending Cardiologist [place]

## 2016-01-15 NOTE — ED Notes (Signed)
Triage nurse explained delay , process and wait time to pt. And family.

## 2016-01-15 NOTE — Telephone Encounter (Signed)
Rx request sent to pharmacy.  

## 2016-01-15 NOTE — ED Notes (Signed)
Pt. reports " sharp" left upper chest pain with SOB and nausea onset yesterday , denies emesis or diaphoresis , history of CAD /CABG/Coronary stent , his cardiologist is Dr. Johnsie Cancel

## 2016-02-07 NOTE — Progress Notes (Signed)
Patient ID: [name], male   DOB: August 19, 1946, 70 y.o.   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 History of CAD Previous stents to LAD. Failed angioplasty 2002 with wire perforation and subsequent single vessel CABG  Dr Olevia Perches had done procedure And was attempting to open a totally occluded stent. No significant disease in RCA or circumflex. Echo in 2011 showed normal EF 65% No recent myovue. Hospitalization in 7/15  with chest pain R/O myovue normal. Had malaise and fatigue and statin stopped. Feels better with less malaise and cramping  Intolerant to crestor and lipitor  Zetia too expensive and he may stop it in 3 months  Last lipid profile with Dr Delena Bali in August Resuts not available to [place]   06/21/14 normal myovue  Been to hospital in [place] a lot.  Normal lexiscan myovue in June and had GB removed in July No chest pain ? GB not working but no stones Had post op nausea for a long time   Ran out of zetia a month ago  Very expensive   ROS: Denies fever, malais, weight loss, blurry vision, decreased visual acuity, cough, sputum, SOB, hemoptysis, pleuritic pain, palpitaitons, heartburn, abdominal pain, melena, lower extremity edema, claudication, or rash.  All other systems reviewed and negative  General: Affect appropriate Healthy:  appears stated age 38: normal Neck supple with no adenopathy JVP normal no bruits no thyromegaly Lungs clear with no wheezing and good diaphragmatic motion Heart:  S1/S2 no murmur, no rub, gallop or click PMI normal Abdomen: benighn, BS positve, no tenderness, no AAA post lap choly  no bruit.  No HSM or HJR Distal pulses intact with no bruits No edema Neuro non-focal Skin warm and dry No muscular weakness   Current Outpatient Prescriptions  Medication Sig Dispense Refill  . aspirin 81 MG tablet Take 81 mg by mouth daily.     . Cholecalciferol (VITAMIN D-3) 1000 UNITS CAPS Take 1 capsule by mouth daily.    . Coenzyme Q10 (COQ-10 PO) Take 1  tablet by mouth daily.    . enalapril (VASOTEC) 20 MG tablet Take 10-20 mg by mouth daily.    Marland Kitchen ezetimibe (ZETIA) 10 MG tablet Take 1 tablet (10 mg total) by mouth daily. 90 tablet 3  . fexofenadine (ALLEGRA) 180 MG tablet Take 180 mg by mouth daily as needed for allergies or rhinitis.    . fish oil-omega-3 fatty acids 1000 MG capsule Take 2 g by mouth 2 (two) times daily.     Marland Kitchen ibuprofen (ADVIL,MOTRIN) 800 MG tablet Take 800 mg by mouth every 8 (eight) hours as needed for moderate pain.    . metoprolol (LOPRESSOR) 50 MG tablet Take 0.5 tablets (25 mg total) by mouth 2 (two) times daily. 90 tablet 3  . nitroGLYCERIN (NITROSTAT) 0.4 MG SL tablet Place 1 tablet (0.4 mg total) under the tongue every 5 (five) minutes as needed for chest pain (CP or SOB). 25 tablet 3  . omeprazole (PRILOSEC) 20 MG capsule Take 20 mg by mouth 2 (two) times daily.  1  . spironolactone-hydrochlorothiazide (ALDACTAZIDE) 25-25 MG tablet TAKE 1/2 TABLET BY MOUTH EVERY DAY 45 tablet 0   No current facility-administered medications for this visit.    Allergies  Codeine; Nabumetone; Statins; and Zolpidem tartrate  Electrocardiogram:  6/14  SR rate 69  Normal  12/20/14  NSR rate 69 normal no change   Assessment and Plan CAD:  Single vessel CABG  [place] to LAD after wire perforation  2002  Normal myovue in [place] June 2017 Chol: Intolerant to statins  Refer to lipid clinic check labs today Refill Zetia  Lab Results  Component Value Date   [place] 81 04/29/2011  GB:  Improved f/u GI in [place]

## 2016-02-08 ENCOUNTER — Encounter: Payer: Self-pay | Admitting: Cardiovascular Disease

## 2016-02-12 ENCOUNTER — Encounter: Payer: Self-pay | Admitting: Cardiovascular Disease

## 2016-02-12 ENCOUNTER — Ambulatory Visit (INDEPENDENT_AMBULATORY_CARE_PROVIDER_SITE_OTHER): Payer: Medicare Other | Admitting: Cardiovascular Disease

## 2016-02-12 VITALS — BP 130/74 | HR 57 | Ht 75.0 in | Wt 219.0 lb

## 2016-02-12 DIAGNOSIS — I25700 Atherosclerosis of coronary artery bypass graft(s), unspecified, with unstable angina pectoris: Secondary | ICD-10-CM | POA: Diagnosis not present

## 2016-02-12 DIAGNOSIS — I10 Essential (primary) hypertension: Secondary | ICD-10-CM

## 2016-02-12 DIAGNOSIS — E785 Hyperlipidemia, unspecified: Secondary | ICD-10-CM

## 2016-02-12 LAB — LIPID PANEL
Cholesterol: 244 mg/dL — ABNORMAL HIGH (ref 125–200)
HDL: 37 mg/dL — AB (ref >=40)
LDL CALC: 176 mg/dL — AB (ref <130)
Total CHOL/HDL Ratio: 6.6 Ratio — ABNORMAL HIGH (ref <=5.0)
Triglycerides: 153 mg/dL — ABNORMAL HIGH (ref <150)
VLDL: 31 mg/dL — AB (ref <30)

## 2016-02-12 LAB — HEPATIC FUNCTION PANEL
ALBUMIN: 3.9 g/dL (ref 3.6–5.1)
ALK PHOS: 52 U/L (ref 40–115)
ALT: 84 U/L — ABNORMAL HIGH (ref 9–46)
AST: 50 U/L — ABNORMAL HIGH (ref 10–35)
BILIRUBIN TOTAL: 0.7 mg/dL (ref 0.2–1.2)
Bilirubin, Direct: <0.1 mg/dL (ref <=0.2)
TOTAL PROTEIN: 6.7 g/dL (ref 6.1–8.1)

## 2016-02-12 MED ORDER — EZETIMIBE 10 MG PO TABS: 10.0000 mg | ORAL_TABLET | Freq: Every day | ORAL | Status: DC

## 2016-02-12 MED ORDER — METOPROLOL TARTRATE 50 MG PO TABS: 25.0000 mg | ORAL_TABLET | Freq: Two times a day (BID) | ORAL | Status: DC

## 2016-02-12 NOTE — Patient Instructions (Addendum)
Medication Instructions:  Your physician recommends that you continue on your current medications as directed. Please refer to the Current Medication list given to you today.  Labwork: Your physician recommends that you have labs today. Lipid and liver panel.   Testing/Procedures: NONE  Follow-Up: Your physician wants you to follow-up in: 12 months with Dr. Johnsie Cancel. You will receive a reminder letter in the mail two months in advance. If you don't receive a letter, please call our office to schedule the follow-up appointment.  Your physician recommends that you schedule a follow-up appointment with Elberta Leatherwood for lipid clinic.   If you need a refill on your cardiac medications before your next appointment, please call your pharmacy.

## 2016-02-13 ENCOUNTER — Telehealth: Payer: Self-pay

## 2016-02-13 NOTE — Telephone Encounter (Signed)
Prior auth for Ezetimibe 10mg  sent to [facility].

## 2016-02-14 ENCOUNTER — Telehealth: Payer: Self-pay

## 2016-02-14 NOTE — Telephone Encounter (Signed)
Fax notification from [facility], stating that brand Zetia is on covered drug list. It will re-process at a lower co-pay. I have forwarded this to his local pharmacy.

## 2016-02-22 ENCOUNTER — Ambulatory Visit (INDEPENDENT_AMBULATORY_CARE_PROVIDER_SITE_OTHER): Payer: Medicare Other | Admitting: Pharmacist

## 2016-02-22 DIAGNOSIS — E785 Hyperlipidemia, unspecified: Secondary | ICD-10-CM

## 2016-02-22 DIAGNOSIS — I25700 Atherosclerosis of coronary artery bypass graft(s), unspecified, with unstable angina pectoris: Secondary | ICD-10-CM

## 2016-02-22 MED ORDER — PRAVASTATIN SODIUM 20 MG PO TABS: 20.0000 mg | ORAL_TABLET | Freq: Every evening | ORAL | Status: DC

## 2016-02-22 NOTE — Progress Notes (Signed)
Patient ID: [name]                 DOB: 03/26/2046, 70 yo                         MRN: [id number]     HPI: [name] is a 70 y.o. male patient referred to lipid clinic by Dr. Johnsie Cancel. PMH is significant for CAD with previous stents to the LAD and CABG, HTN, HLD, and unstable angina. Pt has a history of statin intolerance and mildly elevated LFTs and presents to pharmacy clinic for further lipid management.  Patient reports that he has not been taking Zetia since January. His copay was over $300 per month. Called pharmacy to confirm this - generic is not covered and brand costs $309 per month.   Pt has history of chronically mildly elevated LFTs. Reports that he was told he has non alcoholic fatty liver disease per Dr. Lyda Blackburn, his GI doctor in [place]. He had his gallbladder removed a year ago. LFTs elevated even when pt not on statin therapy. Were stable when he was on low dose Crestor a few years ago. Does not drink alcohol and not abusing Tylenol.  Current Medications: none Intolerances: Crestor 10mg  BIW, TIW, and daily - cramping in hands and legs. Tried Lipitor - severe chest pain for a few hours after each dose.  Risk Factors: ASCVD - CAD s/p PCI and CABG LDL goal: < 70mg /dL  Diet: Doesn't eat breakfast. Lunch: ham sandwich. Dinner: crock pot meals, chicken or steak, soups.   Exercise: Walks his 2 dogs twice a day for 15 minutes each.  Family History: Father with DM, CAD, and HTN, sister with cancer, mother with arthritis.  Social History: Does not drink alcohol, does not smoke or use illicit drugs.  Labs: 01/2016: TC 244, TG 153, HDL 37, LDL 176, ALT 84, ALT 50 (no therapy)  Past Medical History  Diagnosis Date  . CAD (coronary artery disease)     a. s/p CABG x 1 (LIMA->LAD);  b. 04/2013 Ex MV: Ex time 57mins, no isch/infarct, EF 60%.  [name] HTN (hypertension)   . Hyperlipidemia     intolerant of all statins due to muscle aches  . Prostate cancer [facility])     s/p  resection  . OSA (obstructive sleep apnea)     resolved with weight loss  . S/P colonoscopy with polypectomy   . Acid reflux     Current Outpatient Prescriptions on File Prior to Visit  Medication Sig Dispense Refill  . aspirin 81 MG tablet Take 81 mg by mouth daily.     . Cholecalciferol (VITAMIN D-3) 1000 UNITS CAPS Take 1 capsule by mouth daily.    . Coenzyme Q10 (COQ-10 PO) Take 1 tablet by mouth daily.    . enalapril (VASOTEC) 20 MG tablet Take 10-20 mg by mouth daily.    [name] ezetimibe (ZETIA) 10 MG tablet Take 1 tablet (10 mg total) by mouth daily. 90 tablet 3  . fexofenadine (ALLEGRA) 180 MG tablet Take 180 mg by mouth daily as needed for allergies or rhinitis.    . fish oil-omega-3 fatty acids 1000 MG capsule Take 2 g by mouth 2 (two) times daily.     [name] ibuprofen (ADVIL,MOTRIN) 800 MG tablet Take 800 mg by mouth every 8 (eight) hours as needed for moderate pain.    . metoprolol (LOPRESSOR) 50 MG tablet Take 0.5 tablets (25 mg total) by  mouth 2 (two) times daily. 90 tablet 3  . nitroGLYCERIN (NITROSTAT) 0.4 MG SL tablet Place 1 tablet (0.4 mg total) under the tongue every 5 (five) minutes as needed for chest pain (CP or SOB). 25 tablet 3  . omeprazole (PRILOSEC) 20 MG capsule Take 20 mg by mouth 2 (two) times daily.  1  . spironolactone-hydrochlorothiazide (ALDACTAZIDE) 25-25 MG tablet TAKE 1/2 TABLET BY MOUTH EVERY DAY 45 tablet 0   No current facility-administered medications on file prior to visit.    Allergies  Allergen Reactions  . Codeine Nausea And Vomiting  . Nabumetone     "FELT LIKE I WAS GOING TO PASS OUT"  . Statins     CRAMPS, "FELT LIKE I WAS HAVING A HEART ATTACK  . Zolpidem Tartrate     "DONT KNOW NOTHING"    Assessment/Plan:  1. Hyperlipidemia - LLD of 176 above goal <70 given ASCVD with CAD s/p CABG and PCI. Pt is intolerant to Crestor 10mg  daily and twice weekly (myalgias) and unknown dose of Lipitor (chest pain). Options are limited - his Zetia copay is  over $300 per month and no extra assistance is available. Do not have samples since Zetia is generic. Expect cost to decrease by the end of June 6 months after generic became available. Advised him to contact retail pharmacy at that time for copay update. Discussed PCSK9i therapy - too expensive for patient due to [facility] insurance. LFTs also consistently mildly elevated even when off statin therapy due to fatty liver disease. LFTs were stable when pt was on Crestor a few years ago. Will try low dose pravastatin 20mg  daily and recheck LFTs in 1 month at outside lab since pt lives an hour away. Results to be faxed to lipid clinic. Advised pt he can cut tablet in half if he develops myalgias. Discussed upcoming lipid trials - pt would be interested in learning more. Will give name to research foundation to contact him once enrollment begins.   Megan E. Supple, PharmD, [place] [id number] [place], [place] [address] Phone: [phone]; Fax: [phone] 02/22/2016 12:44 PM

## 2016-02-22 NOTE — Patient Instructions (Signed)
Pick up your prescription for pravastatin 20mg  once daily. If you start to have muscle aches, you can cut the tablets in half. Have your liver enzymes checked at a local [facility] in 1 month on 03/25/16 and have them fax [place] the results [phone].  Zetia should become cheaper within the next few months - try asking them to rebill your insurance in June to see if it is any cheaper.  We will give your name to the research foundation at [facility] to see if you qualify for any future lipid trials.  Call  Demichael Traum in lipid clinic with any questions [phone].

## 2016-03-22 ENCOUNTER — Other Ambulatory Visit: Payer: Self-pay | Admitting: Cardiovascular Disease

## 2016-03-25 ENCOUNTER — Other Ambulatory Visit: Payer: Medicare Other

## 2016-03-25 ENCOUNTER — Other Ambulatory Visit: Payer: Self-pay | Admitting: Cardiovascular Disease

## 2016-03-25 DIAGNOSIS — E785 Hyperlipidemia, unspecified: Secondary | ICD-10-CM | POA: Diagnosis not present

## 2016-03-25 LAB — HEPATIC FUNCTION PANEL
ALK PHOS: 56 IU/L (ref 39–117)
ALT: 107 IU/L — ABNORMAL HIGH (ref 0–44)
AST: 67 IU/L — ABNORMAL HIGH (ref 0–40)
Albumin: 4.1 g/dL (ref 3.6–4.8)
Bilirubin Total: 0.5 mg/dL (ref 0.0–1.2)
Bilirubin, Direct: 0.12 mg/dL (ref 0.00–0.40)
TOTAL PROTEIN: 6.6 g/dL (ref 6.0–8.5)

## 2016-03-26 ENCOUNTER — Encounter: Payer: Self-pay | Admitting: Cardiovascular Disease

## 2016-03-27 DIAGNOSIS — B369 Superficial mycosis, unspecified: Secondary | ICD-10-CM | POA: Diagnosis not present

## 2016-03-27 DIAGNOSIS — H6241 Otitis externa in other diseases classified elsewhere, right ear: Secondary | ICD-10-CM | POA: Diagnosis not present

## 2016-03-27 DIAGNOSIS — J3489 Other specified disorders of nose and nasal sinuses: Secondary | ICD-10-CM | POA: Diagnosis not present

## 2016-03-27 DIAGNOSIS — H903 Sensorineural hearing loss, bilateral: Secondary | ICD-10-CM | POA: Diagnosis not present

## 2016-03-27 DIAGNOSIS — J342 Deviated nasal septum: Secondary | ICD-10-CM | POA: Diagnosis not present

## 2016-05-01 ENCOUNTER — Other Ambulatory Visit: Payer: Self-pay | Admitting: Cardiovascular Disease

## 2016-05-01 ENCOUNTER — Telehealth: Payer: Self-pay | Admitting: Pharmacist

## 2016-05-01 ENCOUNTER — Encounter: Payer: Self-pay | Admitting: Cardiovascular Disease

## 2016-05-01 DIAGNOSIS — E785 Hyperlipidemia, unspecified: Secondary | ICD-10-CM | POA: Diagnosis not present

## 2016-05-01 LAB — HEPATIC FUNCTION PANEL
ALT: 122 IU/L — AB (ref 0–44)
AST: 92 IU/L — AB (ref 0–40)
Albumin: 4.1 g/dL (ref 3.6–4.8)
Alkaline Phosphatase: 63 IU/L (ref 39–117)
BILIRUBIN TOTAL: 0.7 mg/dL (ref 0.0–1.2)
BILIRUBIN, DIRECT: 0.17 mg/dL (ref 0.00–0.40)
Total Protein: 6.9 g/dL (ref 6.0–8.5)

## 2016-05-01 NOTE — Telephone Encounter (Signed)
Received pt's lab results.  AST 92 and ALT 122.  This continues to increase from baseline with addition of pravastatin.  Given trends, will have patient stop pravastatin.  Suggest evaluation for clinical trial since unable to afford PCSK-9 inhibitor.  Will send note to research team to see if he can be evaluated for neurocog trial.  Pt aware of recommendations.

## 2016-05-13 DIAGNOSIS — H52221 Regular astigmatism, right eye: Secondary | ICD-10-CM | POA: Diagnosis not present

## 2016-05-13 DIAGNOSIS — H2513 Age-related nuclear cataract, bilateral: Secondary | ICD-10-CM | POA: Diagnosis not present

## 2016-06-05 DIAGNOSIS — Z6831 Body mass index (BMI) 31.0-31.9, adult: Secondary | ICD-10-CM | POA: Diagnosis not present

## 2016-06-05 DIAGNOSIS — T63421A Toxic effect of venom of ants, accidental (unintentional), initial encounter: Secondary | ICD-10-CM | POA: Diagnosis not present

## 2016-06-05 DIAGNOSIS — L03115 Cellulitis of right lower limb: Secondary | ICD-10-CM | POA: Diagnosis not present

## 2016-07-03 DIAGNOSIS — H6241 Otitis externa in other diseases classified elsewhere, right ear: Secondary | ICD-10-CM | POA: Diagnosis not present

## 2016-07-03 DIAGNOSIS — J342 Deviated nasal septum: Secondary | ICD-10-CM | POA: Diagnosis not present

## 2016-07-03 DIAGNOSIS — H903 Sensorineural hearing loss, bilateral: Secondary | ICD-10-CM | POA: Diagnosis not present

## 2016-07-03 DIAGNOSIS — B369 Superficial mycosis, unspecified: Secondary | ICD-10-CM | POA: Diagnosis not present

## 2016-07-04 ENCOUNTER — Other Ambulatory Visit: Payer: Self-pay | Admitting: Cardiovascular Disease

## 2016-07-09 DIAGNOSIS — C61 Malignant neoplasm of prostate: Secondary | ICD-10-CM | POA: Diagnosis not present

## 2016-07-10 DIAGNOSIS — B369 Superficial mycosis, unspecified: Secondary | ICD-10-CM | POA: Diagnosis not present

## 2016-07-10 DIAGNOSIS — J342 Deviated nasal septum: Secondary | ICD-10-CM | POA: Diagnosis not present

## 2016-07-10 DIAGNOSIS — H6241 Otitis externa in other diseases classified elsewhere, right ear: Secondary | ICD-10-CM | POA: Diagnosis not present

## 2016-07-10 DIAGNOSIS — H903 Sensorineural hearing loss, bilateral: Secondary | ICD-10-CM | POA: Diagnosis not present

## 2016-07-17 ENCOUNTER — Other Ambulatory Visit: Payer: Self-pay | Admitting: Pharmacist

## 2016-07-17 ENCOUNTER — Encounter: Payer: Self-pay | Admitting: *Deleted

## 2016-07-17 DIAGNOSIS — Z006 Encounter for examination for normal comparison and control in clinical research program: Secondary | ICD-10-CM

## 2016-07-17 NOTE — Progress Notes (Signed)
Spoke with subject today regarding re-screening for the CLEAR research study. Subject has increased liver enzymes which makes him ineligible for the study. The enzymes are trending downward from results that were drawn in June 2017 in the [facility].  Spoke with Fuller Canada who recommended re-drawing labs for the study in late September.  Subject had been taking pravachol and stopped in June 2017.

## 2016-07-17 NOTE — Progress Notes (Signed)
Late entry:  Subject met inclusion and exclusion criteria. The informed consent form, study requirements and expectations were reviewed with the subject and questions and concerns were addressed prior to the signing of the consent form. The subject verbalized understanding of the trail requirements. The subject agreed to participate in theCLEARtrial and signed the informed consent. The informed consent was obtained prior to performance of any protocol-specific procedures for the subject. A copy of the signed informed consent was given to the subject and a copy was placed in the subject's medical record.    Jake Bathe, RN [phone]

## 2016-07-19 DIAGNOSIS — K5730 Diverticulosis of large intestine without perforation or abscess without bleeding: Secondary | ICD-10-CM | POA: Diagnosis not present

## 2016-07-19 DIAGNOSIS — R972 Elevated prostate specific antigen [PSA]: Secondary | ICD-10-CM | POA: Diagnosis not present

## 2016-07-19 DIAGNOSIS — C61 Malignant neoplasm of prostate: Secondary | ICD-10-CM | POA: Diagnosis not present

## 2016-07-23 ENCOUNTER — Other Ambulatory Visit: Payer: Self-pay

## 2016-09-06 DIAGNOSIS — C61 Malignant neoplasm of prostate: Secondary | ICD-10-CM | POA: Diagnosis not present

## 2016-09-09 DIAGNOSIS — K5730 Diverticulosis of large intestine without perforation or abscess without bleeding: Secondary | ICD-10-CM | POA: Diagnosis not present

## 2016-09-09 DIAGNOSIS — K219 Gastro-esophageal reflux disease without esophagitis: Secondary | ICD-10-CM | POA: Diagnosis not present

## 2016-09-12 DIAGNOSIS — J208 Acute bronchitis due to other specified organisms: Secondary | ICD-10-CM | POA: Diagnosis not present

## 2016-09-12 DIAGNOSIS — Z6831 Body mass index (BMI) 31.0-31.9, adult: Secondary | ICD-10-CM | POA: Diagnosis not present

## 2016-09-17 DIAGNOSIS — Z79899 Other long term (current) drug therapy: Secondary | ICD-10-CM | POA: Diagnosis not present

## 2016-09-17 DIAGNOSIS — I252 Old myocardial infarction: Secondary | ICD-10-CM | POA: Diagnosis not present

## 2016-09-17 DIAGNOSIS — Z9049 Acquired absence of other specified parts of digestive tract: Secondary | ICD-10-CM | POA: Diagnosis not present

## 2016-09-17 DIAGNOSIS — Z800 Family history of malignant neoplasm of digestive organs: Secondary | ICD-10-CM | POA: Diagnosis not present

## 2016-09-17 DIAGNOSIS — K2980 Duodenitis without bleeding: Secondary | ICD-10-CM | POA: Diagnosis not present

## 2016-09-17 DIAGNOSIS — K449 Diaphragmatic hernia without obstruction or gangrene: Secondary | ICD-10-CM | POA: Diagnosis not present

## 2016-09-17 DIAGNOSIS — K317 Polyp of stomach and duodenum: Secondary | ICD-10-CM | POA: Diagnosis not present

## 2016-09-17 DIAGNOSIS — K2990 Gastroduodenitis, unspecified, without bleeding: Secondary | ICD-10-CM | POA: Diagnosis not present

## 2016-09-17 DIAGNOSIS — K219 Gastro-esophageal reflux disease without esophagitis: Secondary | ICD-10-CM | POA: Diagnosis not present

## 2016-09-17 DIAGNOSIS — Z951 Presence of aortocoronary bypass graft: Secondary | ICD-10-CM | POA: Diagnosis not present

## 2016-09-17 DIAGNOSIS — I10 Essential (primary) hypertension: Secondary | ICD-10-CM | POA: Diagnosis not present

## 2016-09-17 DIAGNOSIS — Z8546 Personal history of malignant neoplasm of prostate: Secondary | ICD-10-CM | POA: Diagnosis not present

## 2016-09-17 DIAGNOSIS — D131 Benign neoplasm of stomach: Secondary | ICD-10-CM | POA: Diagnosis not present

## 2016-09-24 DIAGNOSIS — C61 Malignant neoplasm of prostate: Secondary | ICD-10-CM | POA: Diagnosis not present

## 2016-10-21 DIAGNOSIS — D131 Benign neoplasm of stomach: Secondary | ICD-10-CM | POA: Diagnosis not present

## 2016-10-21 DIAGNOSIS — K219 Gastro-esophageal reflux disease without esophagitis: Secondary | ICD-10-CM | POA: Diagnosis not present

## 2016-10-22 DIAGNOSIS — Z79899 Other long term (current) drug therapy: Secondary | ICD-10-CM | POA: Diagnosis not present

## 2016-10-22 DIAGNOSIS — Z888 Allergy status to other drugs, medicaments and biological substances status: Secondary | ICD-10-CM | POA: Diagnosis not present

## 2016-10-22 DIAGNOSIS — J45909 Unspecified asthma, uncomplicated: Secondary | ICD-10-CM | POA: Diagnosis not present

## 2016-10-22 DIAGNOSIS — Z8546 Personal history of malignant neoplasm of prostate: Secondary | ICD-10-CM | POA: Diagnosis not present

## 2016-10-22 DIAGNOSIS — Z7982 Long term (current) use of aspirin: Secondary | ICD-10-CM | POA: Diagnosis not present

## 2016-10-22 DIAGNOSIS — Z87891 Personal history of nicotine dependence: Secondary | ICD-10-CM | POA: Diagnosis not present

## 2016-10-22 DIAGNOSIS — I10 Essential (primary) hypertension: Secondary | ICD-10-CM | POA: Diagnosis not present

## 2016-10-22 DIAGNOSIS — C61 Malignant neoplasm of prostate: Secondary | ICD-10-CM | POA: Diagnosis not present

## 2016-10-22 DIAGNOSIS — Z885 Allergy status to narcotic agent status: Secondary | ICD-10-CM | POA: Diagnosis not present

## 2016-10-22 DIAGNOSIS — Z9079 Acquired absence of other genital organ(s): Secondary | ICD-10-CM | POA: Diagnosis not present

## 2016-10-22 DIAGNOSIS — Z08 Encounter for follow-up examination after completed treatment for malignant neoplasm: Secondary | ICD-10-CM | POA: Diagnosis not present

## 2016-11-21 DIAGNOSIS — J019 Acute sinusitis, unspecified: Secondary | ICD-10-CM | POA: Diagnosis not present

## 2016-11-21 DIAGNOSIS — J208 Acute bronchitis due to other specified organisms: Secondary | ICD-10-CM | POA: Diagnosis not present

## 2016-12-06 DIAGNOSIS — J019 Acute sinusitis, unspecified: Secondary | ICD-10-CM | POA: Diagnosis not present

## 2016-12-24 DIAGNOSIS — H6242 Otitis externa in other diseases classified elsewhere, left ear: Secondary | ICD-10-CM | POA: Diagnosis not present

## 2016-12-24 DIAGNOSIS — H903 Sensorineural hearing loss, bilateral: Secondary | ICD-10-CM | POA: Diagnosis not present

## 2016-12-24 DIAGNOSIS — H6241 Otitis externa in other diseases classified elsewhere, right ear: Secondary | ICD-10-CM | POA: Diagnosis not present

## 2016-12-24 DIAGNOSIS — B369 Superficial mycosis, unspecified: Secondary | ICD-10-CM | POA: Diagnosis not present

## 2017-01-01 DIAGNOSIS — K648 Other hemorrhoids: Secondary | ICD-10-CM | POA: Diagnosis not present

## 2017-01-01 DIAGNOSIS — K219 Gastro-esophageal reflux disease without esophagitis: Secondary | ICD-10-CM | POA: Diagnosis not present

## 2017-01-01 DIAGNOSIS — D131 Benign neoplasm of stomach: Secondary | ICD-10-CM | POA: Diagnosis not present

## 2017-01-07 DIAGNOSIS — R972 Elevated prostate specific antigen [PSA]: Secondary | ICD-10-CM | POA: Diagnosis not present

## 2017-01-21 DIAGNOSIS — Z9049 Acquired absence of other specified parts of digestive tract: Secondary | ICD-10-CM | POA: Diagnosis not present

## 2017-01-21 DIAGNOSIS — D126 Benign neoplasm of colon, unspecified: Secondary | ICD-10-CM | POA: Diagnosis not present

## 2017-01-21 DIAGNOSIS — Z800 Family history of malignant neoplasm of digestive organs: Secondary | ICD-10-CM | POA: Diagnosis not present

## 2017-01-21 DIAGNOSIS — Z8546 Personal history of malignant neoplasm of prostate: Secondary | ICD-10-CM | POA: Diagnosis not present

## 2017-01-21 DIAGNOSIS — D122 Benign neoplasm of ascending colon: Secondary | ICD-10-CM | POA: Diagnosis not present

## 2017-01-21 DIAGNOSIS — I10 Essential (primary) hypertension: Secondary | ICD-10-CM | POA: Diagnosis not present

## 2017-01-21 DIAGNOSIS — K5730 Diverticulosis of large intestine without perforation or abscess without bleeding: Secondary | ICD-10-CM | POA: Diagnosis not present

## 2017-01-21 DIAGNOSIS — I252 Old myocardial infarction: Secondary | ICD-10-CM | POA: Diagnosis not present

## 2017-01-21 DIAGNOSIS — Z7982 Long term (current) use of aspirin: Secondary | ICD-10-CM | POA: Diagnosis not present

## 2017-01-21 DIAGNOSIS — K648 Other hemorrhoids: Secondary | ICD-10-CM | POA: Diagnosis not present

## 2017-01-21 DIAGNOSIS — Z952 Presence of prosthetic heart valve: Secondary | ICD-10-CM | POA: Diagnosis not present

## 2017-01-21 DIAGNOSIS — Z951 Presence of aortocoronary bypass graft: Secondary | ICD-10-CM | POA: Diagnosis not present

## 2017-01-21 DIAGNOSIS — D124 Benign neoplasm of descending colon: Secondary | ICD-10-CM | POA: Diagnosis not present

## 2017-01-21 DIAGNOSIS — Z86010 Personal history of colonic polyps: Secondary | ICD-10-CM | POA: Diagnosis not present

## 2017-01-21 DIAGNOSIS — Z79899 Other long term (current) drug therapy: Secondary | ICD-10-CM | POA: Diagnosis not present

## 2017-01-30 ENCOUNTER — Emergency Department (HOSPITAL_COMMUNITY)
Admission: EM | Admit: 2017-01-30 | Discharge: 2017-01-31 | Disposition: A | Discharge status: Home / Self Care | Payer: Medicare Other | Attending: Emergency Medicine | Admitting: Emergency Medicine

## 2017-01-30 ENCOUNTER — Emergency Department (HOSPITAL_COMMUNITY): Payer: Medicare Other

## 2017-01-30 ENCOUNTER — Encounter (HOSPITAL_COMMUNITY): Payer: Self-pay | Admitting: Emergency Medicine

## 2017-01-30 DIAGNOSIS — M79632 Pain in left forearm: Secondary | ICD-10-CM | POA: Diagnosis not present

## 2017-01-30 DIAGNOSIS — I10 Essential (primary) hypertension: Secondary | ICD-10-CM | POA: Insufficient documentation

## 2017-01-30 DIAGNOSIS — Z87891 Personal history of nicotine dependence: Secondary | ICD-10-CM | POA: Insufficient documentation

## 2017-01-30 DIAGNOSIS — Z7982 Long term (current) use of aspirin: Secondary | ICD-10-CM | POA: Insufficient documentation

## 2017-01-30 DIAGNOSIS — R200 Anesthesia of skin: Secondary | ICD-10-CM | POA: Diagnosis not present

## 2017-01-30 DIAGNOSIS — I2510 Atherosclerotic heart disease of native coronary artery without angina pectoris: Secondary | ICD-10-CM | POA: Insufficient documentation

## 2017-01-30 DIAGNOSIS — M25522 Pain in left elbow: Secondary | ICD-10-CM | POA: Diagnosis not present

## 2017-01-30 DIAGNOSIS — M79602 Pain in left arm: Secondary | ICD-10-CM | POA: Insufficient documentation

## 2017-01-30 DIAGNOSIS — Z951 Presence of aortocoronary bypass graft: Secondary | ICD-10-CM | POA: Insufficient documentation

## 2017-01-30 DIAGNOSIS — R202 Paresthesia of skin: Secondary | ICD-10-CM | POA: Diagnosis not present

## 2017-01-30 DIAGNOSIS — Z79899 Other long term (current) drug therapy: Secondary | ICD-10-CM | POA: Diagnosis not present

## 2017-01-30 DIAGNOSIS — Z955 Presence of coronary angioplasty implant and graft: Secondary | ICD-10-CM | POA: Insufficient documentation

## 2017-01-30 DIAGNOSIS — I700 Atherosclerosis of aorta: Secondary | ICD-10-CM | POA: Diagnosis not present

## 2017-01-30 DIAGNOSIS — Z8546 Personal history of malignant neoplasm of prostate: Secondary | ICD-10-CM | POA: Diagnosis not present

## 2017-01-30 LAB — CBC
HCT: 45 % (ref 39.0–52.0)
HEMOGLOBIN: 15.4 g/dL (ref 13.0–17.0)
MCH: 33 pg (ref 26.0–34.0)
MCHC: 34.2 g/dL (ref 30.0–36.0)
MCV: 96.4 fL (ref 78.0–100.0)
PLATELETS: 261 10*3/uL (ref 150–400)
RBC: 4.67 MIL/uL (ref 4.22–5.81)
RDW: 13 % (ref 11.5–15.5)
WBC: 15.5 10*3/uL — AB (ref 4.0–10.5)

## 2017-01-30 LAB — BASIC METABOLIC PANEL
ANION GAP: 9 (ref 5–15)
BUN: 13 mg/dL (ref 6–20)
CALCIUM: 9.3 mg/dL (ref 8.9–10.3)
CO2: 24 mmol/L (ref 22–32)
Chloride: 101 mmol/L (ref 101–111)
Creatinine, Ser: 0.84 mg/dL (ref 0.61–1.24)
Glucose, Bld: 139 mg/dL — ABNORMAL HIGH (ref 65–99)
Potassium: 4.1 mmol/L (ref 3.5–5.1)
Sodium: 134 mmol/L — ABNORMAL LOW (ref 135–145)

## 2017-01-30 LAB — I-STAT TROPONIN, ED: TROPONIN I, POC: 0 ng/mL (ref 0.00–0.08)

## 2017-01-30 NOTE — ED Triage Notes (Addendum)
Pt presents to ER with LEFT arm tingling and pain; pt states that the pain feels like tourniquet around the upper arm causing tingling distally and throbbing to the elbow; pt denies known injury, denies CP; pt reporting CABG hx

## 2017-01-30 NOTE — ED Notes (Signed)
Patient transported to X-ray 

## 2017-01-31 NOTE — ED Provider Notes (Signed)
[facility] DEPT Provider Note   CSN: [id number] Arrival date & time: 01/30/17  2113  By signing my name below, I, [name], attest that this documentation has been prepared under the direction and in the presence of Alexander Greek, MD. Electronically Signed: Collene Blackburn, Scribe. 01/31/17. 12:28 AM.   History   Chief Complaint Chief Complaint  Patient presents with  . Arm Pain    L   HPI Comments: [name] is a 71 y.o. male with a hx of CAD, HTN, HLD, and DM, who presents to the Emergency Department complaining of sudden-onset, intermittent left forearm pain that began yesterday. Patient states he was lying around both yesterday and today, no increased activity. Patient states the pain went away this morning and returned at 4 PM this afternoon. Patient reports prior tendonitis of the elbow. Patient has associated left forearm numbness. No modifying factors indicated. Patient reports increased yard work one week ago. Patient denies any heavy lifting, chest pain, shortness of breath, or increased activity.   The history is provided by the patient. No language interpreter was used.    Past Medical History:  Diagnosis Date  . Acid reflux   . CAD (coronary artery disease)    a. s/p CABG x 1 (LIMA->LAD);  b. 04/2013 Ex MV: Ex time 23mins, no isch/infarct, EF 60%.  Marland Kitchen HTN (hypertension)   . Hyperlipidemia    intolerant of all statins due to muscle aches  . OSA (obstructive sleep apnea)    resolved with weight loss  . Prostate cancer [facility])    s/p resection  . S/P colonoscopy with polypectomy     Patient Active Problem List   Diagnosis Date Noted  . Chest pain   . S/P CABG x 1   . Accelerated hypertension   . Unstable angina ([place]) 04/27/2013  . Coronary atherosclerosis of native coronary artery 07/26/2011  . Elevated lipids 07/03/2009  . HYPERTENSION, BENIGN 07/03/2009  . CAD, ARTERY BYPASS GRAFT 07/03/2009  . BRADYCARDIA 07/03/2009    Past Surgical  History:  Procedure Laterality Date  . CORONARY ARTERY BYPASS GRAFT  2002  . CORONARY STENT PLACEMENT    . TOTAL HIP ARTHROPLASTY Bilateral        Home Medications    Prior to Admission medications   Medication Sig Start Date End Date Taking? Authorizing Provider  Ascorbic Acid (VITAMIN C PO) Take 1 tablet by mouth daily.   Yes Historical Provider, MD  aspirin 81 MG tablet Take 81 mg by mouth daily.    Yes Historical Provider, MD  Cholecalciferol (VITAMIN D-3) 1000 UNITS CAPS Take 1 capsule by mouth daily.   Yes Historical Provider, MD  Coenzyme Q10 (COQ-10 PO) Take 1 tablet by mouth daily.   Yes Historical Provider, MD  enalapril (VASOTEC) 20 MG tablet Take one-half tablet by mouth in the morning and one tablet by mouth in the evening Patient taking differently: Take 10-20 mg by mouth See admin instructions. Take one-half tablet by mouth in the morning and one tablet by mouth in the evening 07/05/16  Yes Josue Hector, MD  fexofenadine (ALLEGRA) 180 MG tablet Take 180 mg by mouth daily as needed for allergies or rhinitis.   Yes Historical Provider, MD  fish oil-omega-3 fatty acids 1000 MG capsule Take 2 g by mouth 2 (two) times daily.    Yes Historical Provider, MD  metoprolol (LOPRESSOR) 50 MG tablet Take 0.5 tablets (25 mg total) by mouth 2 (two) times daily. 02/12/16  Yes  Josue Hector, MD  Multiple Vitamin (MULTIVITAMIN WITH MINERALS) TABS tablet Take 1 tablet by mouth daily.   Yes Historical Provider, MD  nitroGLYCERIN (NITROSTAT) 0.4 MG SL tablet Place 1 tablet (0.4 mg total) under the tongue every 5 (five) minutes as needed for chest pain (CP or SOB). 04/27/13  Yes Rogelia Mire, NP  omeprazole (PRILOSEC) 20 MG capsule Take 20 mg by mouth 2 (two) times daily. 12/23/15  Yes Historical Provider, MD  ranitidine (ZANTAC) 300 MG tablet Take 300 mg by mouth every evening. 12/31/16  Yes Historical Provider, MD  spironolactone-hydrochlorothiazide (ALDACTAZIDE) 25-25 MG tablet TAKE 1/2  TABLET BY MOUTH DAILY 03/22/16  Yes Josue Hector, MD    Family History Family History  Problem Relation Age of Onset  . Diabetes Father   . Coronary artery disease Father   . Hypertension Father   . Cancer Sister   . Arthritis Mother     Social History Social History  Substance Use Topics  . Smoking status: Former Smoker    Quit date: 11/26/1987  . Smokeless tobacco: Never Used  . Alcohol use Yes     Comment: occasionally     Allergies   Codeine; Nabumetone; Oxycodone; Statins; Zolpidem tartrate; and Sulfamethoxazole-trimethoprim   Review of Systems Review of Systems  Constitutional: Negative for fever.  Respiratory: Negative for shortness of breath.   Cardiovascular: Negative for chest pain.  Gastrointestinal: Negative for nausea and vomiting.  Musculoskeletal: Positive for arthralgias (left forearm, elbow pain). Negative for joint swelling.  Neurological: Positive for numbness (left forearm).  All other systems reviewed and are negative.    Physical Exam Updated Vital Signs BP 128/90   Pulse 70   Temp 98.2 F (36.8 C) (Oral)   Resp 17   SpO2 95%   Physical Exam  Constitutional: He is oriented to person, place, and time. He appears well-developed and well-nourished. No distress.  HENT:  Head: Normocephalic and atraumatic.  Right Ear: Hearing normal.  Left Ear: Hearing normal.  Nose: Nose normal.  Mouth/Throat: Oropharynx is clear and moist and mucous membranes are normal.  Eyes: Conjunctivae and EOM are normal. Pupils are equal, round, and reactive to light.  Neck: Normal range of motion. Neck supple.  Cardiovascular: Regular rhythm, S1 normal and S2 normal.  Exam reveals no gallop and no friction rub.   No murmur heard. Pulmonary/Chest: Effort normal and breath sounds normal. No respiratory distress. He exhibits no tenderness.  Abdominal: Soft. Normal appearance and bowel sounds are normal. He exhibits no distension. There is no hepatosplenomegaly. There  is no tenderness. There is no tenderness at McBurney's point and negative Murphy's sign.  Musculoskeletal: Normal range of motion. He exhibits tenderness. He exhibits no edema or deformity.  Tenderness to the lateral left forearm.   Neurological: He is alert and oriented to person, place, and time. He has normal strength. No cranial nerve deficit or sensory deficit. Coordination normal. GCS eye subscore is 4. GCS verbal subscore is 5. GCS motor subscore is 6.  Skin: Skin is warm, dry and intact. No rash noted. No cyanosis.  Psychiatric: He has a normal mood and affect. His speech is normal and behavior is normal. Thought content normal.  Nursing note and vitals reviewed.    ED Treatments / Results  DIAGNOSTIC STUDIES: Oxygen Saturation is 96% on RA, adequate by my interpretation.    COORDINATION OF CARE: 12:27 AM Discussed treatment plan with pt at bedside and pt agreed to plan, which includes anti-inflammatory pain  medication.   Labs (all labs ordered are listed, but only abnormal results are displayed) Labs Reviewed  BASIC METABOLIC PANEL - Abnormal; Notable for the following:       Result Value   Sodium 134 (*)    Glucose, Bld 139 (*)    All other components within normal limits  CBC - Abnormal; Notable for the following:    WBC 15.5 (*)    All other components within normal limits  I-STAT TROPOININ, ED    EKG  EKG Interpretation  Date/Time:  Thursday January 30 2017 21:32:42 EST Ventricular Rate:  72 PR Interval:  190 QRS Duration: 92 QT Interval:  362 QTC Calculation: 396 R Axis:   7 Text Interpretation:  Normal sinus rhythm Normal ECG Confirmed by Betsey Holiday  MD, [place] [phone]) on 01/31/2017 12:08:06 AM       Radiology Dg Chest 2 View  Result Date: 01/30/2017 CLINICAL DATA:  Tingling in the left arm EXAM: CHEST  2 VIEW COMPARISON:  01/15/2016 FINDINGS: Post sternotomy changes. No acute pulmonary infiltrate, consolidation, or pleural effusion. Stable borderline  heart size. Atherosclerosis. No pneumothorax. Surgical clips in the right upper quadrant. IMPRESSION: No active cardiopulmonary disease. Electronically Signed   By: Donavan Foil M.D.   On: 01/30/2017 21:52    Procedures Procedures (including critical care time)  Medications Ordered in ED Medications - No data to display   Initial Impression / Assessment and Plan / ED Course  I have reviewed the triage vital signs and the nursing notes.  Pertinent labs & imaging results that were available during my care of the patient were reviewed by me and considered in my medical decision making (see chart for details).    Patient presents with complaints of pain in the left arm. Patient reports that he had pain yesterday and through the night last night. He did not identify any alleviating or exacerbating factors. She is not experiencing any chest pain or shortness of breath. He does have a cardiac history, but this does not feel similar to cardiac issues.  His EKG is normal. Patient has a normal troponin. His symptoms are extremely atypical for cardiac etiology and normal EKG and troponin is very reassuring. Patient has tenderness to palpation on the left forearm, this examination is very consistent with musculoskeletal pain. He was reassured, has follow-up scheduled with his cardiologist in 2 weeks. Will return for any worsening or new symptoms.  Final Clinical Impressions(s) / ED Diagnoses   Final diagnoses:  Left arm pain    New Prescriptions New Prescriptions   No medications on file   I personally performed the services described in this documentation, which was scribed in my presence. The recorded information has been reviewed and is accurate.     Alexander Greek, MD 01/31/17 [phone]

## 2017-02-03 DIAGNOSIS — Z125 Encounter for screening for malignant neoplasm of prostate: Secondary | ICD-10-CM | POA: Diagnosis not present

## 2017-02-03 DIAGNOSIS — Z1389 Encounter for screening for other disorder: Secondary | ICD-10-CM | POA: Diagnosis not present

## 2017-02-03 DIAGNOSIS — Z9181 History of falling: Secondary | ICD-10-CM | POA: Diagnosis not present

## 2017-02-03 DIAGNOSIS — Z136 Encounter for screening for cardiovascular disorders: Secondary | ICD-10-CM | POA: Diagnosis not present

## 2017-02-03 DIAGNOSIS — E669 Obesity, unspecified: Secondary | ICD-10-CM | POA: Diagnosis not present

## 2017-02-03 DIAGNOSIS — Z0000 Encounter for general adult medical examination without abnormal findings: Secondary | ICD-10-CM | POA: Diagnosis not present

## 2017-02-05 ENCOUNTER — Encounter: Payer: Self-pay | Admitting: Cardiovascular Disease

## 2017-02-10 NOTE — Progress Notes (Signed)
Patient ID: [name], male   DOB: November 14, 1946, 71 y.o.   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 History of CAD Previous stents to LAD. Failed angioplasty 2002 with wire perforation and subsequent single vessel CABG  Dr Olevia Perches had done procedure And was attempting to open a totally occluded stent. No significant disease in RCA or circumflex. Echo in 2011 showed normal EF 65% No recent myovue. Hospitalization in 7/15  with chest pain R/O myovue normal. Had malaise and fatigue and statin stopped. Feels better with less malaise and cramping  Intolerant to crestor and lipitor  Zetia too expensive and he may stop it in 3 months  Last lipid profile with Dr Delena Bali in August Resuts not available to [place]   06/21/14 normal myovue  Been to hospital in [place] a lot.  Normal lexiscan myovue in June and had GB removed in July No chest pain ? GB not working but no stones Had post op nausea for a long time   Currently not on cholesterol meds.  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  All other systems reviewed and negative  General: Affect appropriate Overweight white male  HEENT: normal Neck supple with no adenopathy JVP normal no bruits no thyromegaly Lungs clear with no wheezing and good diaphragmatic motion Heart:  S1/S2 no murmur, no rub, gallop or click PMI normal Abdomen: benighn, BS positve, no tenderness, no AAA post lap choly  no bruit.  No HSM or HJR Distal pulses intact with no bruits No edema Neuro non-focal Skin warm and dry No muscular weakness   Current Outpatient Prescriptions  Medication Sig Dispense Refill  . Ascorbic Acid (VITAMIN C PO) Take 1 tablet by mouth daily.    Marland Kitchen aspirin 81 MG tablet Take 81 mg  by mouth daily.     . Cholecalciferol (VITAMIN D-3) 1000 UNITS CAPS Take 1 capsule by mouth daily.    . Coenzyme Q10 (COQ-10 PO) Take 1 tablet by mouth daily.    . enalapril (VASOTEC) 20 MG tablet Take one-half tablet by mouth in the morning and one tablet by mouth in the evening 135 tablet 2  . fexofenadine (ALLEGRA) 180 MG tablet Take 180 mg by mouth daily as needed for allergies or rhinitis.    . fish oil-omega-3 fatty acids 1000 MG capsule Take 2 g by mouth 2 (two) times daily.     . metoprolol (LOPRESSOR) 50 MG tablet Take 0.5 tablets (25 mg total) by mouth 2 (two) times daily. 90 tablet 3  . Multiple Vitamin (MULTIVITAMIN WITH MINERALS) TABS tablet Take 1 tablet by mouth daily.    . nitroGLYCERIN (NITROSTAT) 0.4 MG SL tablet Place 1 tablet (0.4 mg total) under the tongue every 5 (five) minutes as needed for chest pain (CP or SOB). 25 tablet 3  . omeprazole (PRILOSEC) 20 MG capsule Take 20 mg by mouth daily.    . ranitidine (ZANTAC) 300 MG tablet Take 300 mg by mouth every evening.    Marland Kitchen spironolactone-hydrochlorothiazide (ALDACTAZIDE) 25-25 MG tablet Take 0.5 tablets by mouth daily. 45 tablet 3   No current facility-administered medications for this visit.     Allergies  Codeine; Nabumetone; Oxycodone; Statins; Zolpidem tartrate; and Sulfamethoxazole-trimethoprim  Electrocardiogram:  6/14  SR rate 69  Normal  12/20/14  NSR rate 69 normal no change   Assessment and Plan CAD:  Single vessel CABG  [place] to LAD after wire perforation 2002  Normal myovue in [place] June 2017  Chol: Intolerant to statins  Zetia not covered by insurance and over $300/month  Not candidate CLEAR trial due to elevated LFT;s will repeat labs today and if LFTls normal will refer back to research nurses  Lab Results  Component Value Date   ALT 122 (H) 05/01/2016   AST 92 (H) 05/01/2016   ALKPHOS 63 05/01/2016   BILITOT 0.7 05/01/2016    Lab Results  Component Value Date   LDLCALC 176 (H) 02/12/2016     GB:  Improved f/u GI in [place]   HTN:  Well controlled.  Continue current medications and low sodium Dash type diet.     [name]

## 2017-02-14 ENCOUNTER — Encounter: Payer: Self-pay | Admitting: Cardiovascular Disease

## 2017-02-14 ENCOUNTER — Ambulatory Visit (INDEPENDENT_AMBULATORY_CARE_PROVIDER_SITE_OTHER): Payer: Medicare Other | Admitting: Cardiovascular Disease

## 2017-02-14 VITALS — BP 130/90 | HR 53 | Ht 72.0 in | Wt 227.0 lb

## 2017-02-14 DIAGNOSIS — I200 Unstable angina: Secondary | ICD-10-CM | POA: Diagnosis not present

## 2017-02-14 DIAGNOSIS — I25700 Atherosclerosis of coronary artery bypass graft(s), unspecified, with unstable angina pectoris: Secondary | ICD-10-CM | POA: Diagnosis not present

## 2017-02-14 DIAGNOSIS — I10 Essential (primary) hypertension: Secondary | ICD-10-CM

## 2017-02-14 LAB — HEPATIC FUNCTION PANEL
ALBUMIN: 4 g/dL (ref 3.6–4.8)
ALT: 79 IU/L — AB (ref 0–44)
AST: 56 IU/L — AB (ref 0–40)
Alkaline Phosphatase: 63 IU/L (ref 39–117)
BILIRUBIN TOTAL: 0.3 mg/dL (ref 0.0–1.2)
Bilirubin, Direct: 0.11 mg/dL (ref 0.00–0.40)
Total Protein: 6.6 g/dL (ref 6.0–8.5)

## 2017-02-14 LAB — LIPID PANEL
CHOL/HDL RATIO: 5.8 ratio — AB (ref 0.0–5.0)
Cholesterol, Total: 220 mg/dL — ABNORMAL HIGH (ref 100–199)
HDL: 38 mg/dL — ABNORMAL LOW (ref >39)
LDL Calculated: 157 mg/dL — ABNORMAL HIGH (ref 0–99)
Triglycerides: 125 mg/dL (ref 0–149)
VLDL Cholesterol Cal: 25 mg/dL (ref 5–40)

## 2017-02-14 MED ORDER — METOPROLOL TARTRATE 50 MG PO TABS
25.0000 mg | ORAL_TABLET | Freq: Two times a day (BID) | ORAL | 3 refills | Status: DC
Start: 2017-02-14 — End: 2018-03-27

## 2017-02-14 MED ORDER — SPIRONOLACTONE-HCTZ 25-25 MG PO TABS: 0.5000 | ORAL_TABLET | Freq: Every day | ORAL | 3 refills | Status: DC

## 2017-02-14 NOTE — Patient Instructions (Addendum)
Medication Instructions:  Your physician recommends that you continue on your current medications as directed. Please refer to the Current Medication list given to you today.  Labwork: Your physician recommends that you have lab work today - lipid and liver panel.   Testing/Procedures: NONE  Follow-Up: Your physician wants you to follow-up in: 12 months with Dr. Johnsie Cancel. You will receive a reminder letter in the mail two months in advance. If you don't receive a letter, please call our office to schedule the follow-up appointment.   If you need a refill on your cardiac medications before your next appointment, please call your pharmacy.

## 2017-02-18 ENCOUNTER — Telehealth: Payer: Self-pay | Admitting: Pharmacist

## 2017-02-18 DIAGNOSIS — I10 Essential (primary) hypertension: Secondary | ICD-10-CM | POA: Diagnosis not present

## 2017-02-18 DIAGNOSIS — Z888 Allergy status to other drugs, medicaments and biological substances status: Secondary | ICD-10-CM | POA: Diagnosis not present

## 2017-02-18 DIAGNOSIS — J45909 Unspecified asthma, uncomplicated: Secondary | ICD-10-CM | POA: Diagnosis not present

## 2017-02-18 DIAGNOSIS — Z886 Allergy status to analgesic agent status: Secondary | ICD-10-CM | POA: Diagnosis not present

## 2017-02-18 DIAGNOSIS — C61 Malignant neoplasm of prostate: Secondary | ICD-10-CM | POA: Diagnosis not present

## 2017-02-18 DIAGNOSIS — Z79899 Other long term (current) drug therapy: Secondary | ICD-10-CM | POA: Diagnosis not present

## 2017-02-18 DIAGNOSIS — Z7982 Long term (current) use of aspirin: Secondary | ICD-10-CM | POA: Diagnosis not present

## 2017-02-18 DIAGNOSIS — Z885 Allergy status to narcotic agent status: Secondary | ICD-10-CM | POA: Diagnosis not present

## 2017-02-18 DIAGNOSIS — I252 Old myocardial infarction: Secondary | ICD-10-CM | POA: Diagnosis not present

## 2017-02-18 DIAGNOSIS — Z882 Allergy status to sulfonamides status: Secondary | ICD-10-CM | POA: Diagnosis not present

## 2017-02-18 DIAGNOSIS — Z87891 Personal history of nicotine dependence: Secondary | ICD-10-CM | POA: Diagnosis not present

## 2017-02-18 DIAGNOSIS — R9721 Rising PSA following treatment for malignant neoplasm of prostate: Secondary | ICD-10-CM | POA: Diagnosis not present

## 2017-02-18 DIAGNOSIS — Z9079 Acquired absence of other genital organ(s): Secondary | ICD-10-CM | POA: Diagnosis not present

## 2017-02-18 DIAGNOSIS — Z881 Allergy status to other antibiotic agents status: Secondary | ICD-10-CM | POA: Diagnosis not present

## 2017-02-18 MED ORDER — EZETIMIBE 10 MG PO TABS: 10.0000 mg | ORAL_TABLET | Freq: Every day | ORAL | 11 refills | Status: DC

## 2017-02-18 NOTE — Telephone Encounter (Signed)
Spoke with pt regarding lipid panel results. ALT improved to 79 but baseline LDL 157 above goal <70. Pt did not qualify for CLEAR clinical trial due to elevated LFTs. He is intolerant to Crestor 10mg  twice weekly and experienced LFT elevations on both Crestor and pravastatin. Zetia and PCSK9i were too expensive for pt in 2017. Pt does have fatty liver and current LFT elevations are his baseline. Options are very limited. Will try resending rx for Zetia to pharmacy to see if 2018 copay is any cheaper.  Called pharmacy for copay - 1 month of ezetimibe will cost him $53.46 which is cost prohibitive.  Called research group to see if pt can be re-screened for CLEAR since ALT is trending down. Danise Mina, RN will check on this. If note, would be a candidate for ORION-4 trial in the fall. They will follow up with pt.

## 2017-02-19 DIAGNOSIS — H903 Sensorineural hearing loss, bilateral: Secondary | ICD-10-CM | POA: Diagnosis not present

## 2017-02-19 DIAGNOSIS — H6242 Otitis externa in other diseases classified elsewhere, left ear: Secondary | ICD-10-CM | POA: Diagnosis not present

## 2017-02-19 DIAGNOSIS — B369 Superficial mycosis, unspecified: Secondary | ICD-10-CM | POA: Diagnosis not present

## 2017-02-22 DIAGNOSIS — I25810 Atherosclerosis of coronary artery bypass graft(s) without angina pectoris: Secondary | ICD-10-CM | POA: Diagnosis not present

## 2017-02-22 DIAGNOSIS — J201 Acute bronchitis due to Hemophilus influenzae: Secondary | ICD-10-CM | POA: Diagnosis not present

## 2017-02-22 DIAGNOSIS — I252 Old myocardial infarction: Secondary | ICD-10-CM | POA: Diagnosis not present

## 2017-02-22 DIAGNOSIS — Z955 Presence of coronary angioplasty implant and graft: Secondary | ICD-10-CM | POA: Diagnosis not present

## 2017-02-22 DIAGNOSIS — R0600 Dyspnea, unspecified: Secondary | ICD-10-CM | POA: Diagnosis not present

## 2017-02-22 DIAGNOSIS — J069 Acute upper respiratory infection, unspecified: Secondary | ICD-10-CM | POA: Diagnosis not present

## 2017-02-22 DIAGNOSIS — I10 Essential (primary) hypertension: Secondary | ICD-10-CM | POA: Diagnosis not present

## 2017-02-22 DIAGNOSIS — R05 Cough: Secondary | ICD-10-CM | POA: Diagnosis not present

## 2017-02-22 DIAGNOSIS — R0602 Shortness of breath: Secondary | ICD-10-CM | POA: Diagnosis not present

## 2017-02-25 DIAGNOSIS — R091 Pleurisy: Secondary | ICD-10-CM | POA: Diagnosis not present

## 2017-02-25 DIAGNOSIS — J208 Acute bronchitis due to other specified organisms: Secondary | ICD-10-CM | POA: Diagnosis not present

## 2017-02-25 DIAGNOSIS — Z6830 Body mass index (BMI) 30.0-30.9, adult: Secondary | ICD-10-CM | POA: Diagnosis not present

## 2017-02-26 DIAGNOSIS — H6240 Otitis externa in other diseases classified elsewhere, unspecified ear: Secondary | ICD-10-CM | POA: Diagnosis not present

## 2017-02-26 DIAGNOSIS — B369 Superficial mycosis, unspecified: Secondary | ICD-10-CM | POA: Diagnosis not present

## 2017-02-26 DIAGNOSIS — H903 Sensorineural hearing loss, bilateral: Secondary | ICD-10-CM | POA: Diagnosis not present

## 2017-03-01 ENCOUNTER — Other Ambulatory Visit: Payer: Self-pay | Admitting: Cardiovascular Disease

## 2017-03-02 ENCOUNTER — Other Ambulatory Visit: Payer: Self-pay | Admitting: Cardiovascular Disease

## 2017-03-03 NOTE — Telephone Encounter (Signed)
Medication Detail    Disp Refills Start End   metoprolol (LOPRESSOR) 50 MG tablet 90 tablet 3 02/14/2017    Sig - Route: Take 0.5 tablets (25 mg total) by mouth 2 (two) times daily. - Oral   Notes to Pharmacy: Discard any prior refills, pt requested 90/3   E-Prescribing Status: Receipt confirmed by pharmacy (02/14/2017 9:10 AM EDT)   Pharmacy   [id number] - [place], [place]

## 2017-03-17 DIAGNOSIS — J208 Acute bronchitis due to other specified organisms: Secondary | ICD-10-CM | POA: Diagnosis not present

## 2017-03-17 DIAGNOSIS — Z6830 Body mass index (BMI) 30.0-30.9, adult: Secondary | ICD-10-CM | POA: Diagnosis not present

## 2017-03-25 DIAGNOSIS — R972 Elevated prostate specific antigen [PSA]: Secondary | ICD-10-CM | POA: Diagnosis not present

## 2017-03-25 DIAGNOSIS — Z9079 Acquired absence of other genital organ(s): Secondary | ICD-10-CM | POA: Diagnosis not present

## 2017-04-01 DIAGNOSIS — N5231 Erectile dysfunction following radical prostatectomy: Secondary | ICD-10-CM | POA: Diagnosis not present

## 2017-04-01 DIAGNOSIS — C61 Malignant neoplasm of prostate: Secondary | ICD-10-CM | POA: Diagnosis not present

## 2017-04-22 DIAGNOSIS — Z885 Allergy status to narcotic agent status: Secondary | ICD-10-CM | POA: Diagnosis not present

## 2017-04-22 DIAGNOSIS — Z888 Allergy status to other drugs, medicaments and biological substances status: Secondary | ICD-10-CM | POA: Diagnosis not present

## 2017-04-22 DIAGNOSIS — C61 Malignant neoplasm of prostate: Secondary | ICD-10-CM | POA: Diagnosis not present

## 2017-04-22 DIAGNOSIS — Z882 Allergy status to sulfonamides status: Secondary | ICD-10-CM | POA: Diagnosis not present

## 2017-04-25 DIAGNOSIS — C61 Malignant neoplasm of prostate: Secondary | ICD-10-CM | POA: Diagnosis not present

## 2017-04-25 DIAGNOSIS — Z885 Allergy status to narcotic agent status: Secondary | ICD-10-CM | POA: Diagnosis not present

## 2017-04-25 DIAGNOSIS — Z9079 Acquired absence of other genital organ(s): Secondary | ICD-10-CM | POA: Diagnosis not present

## 2017-04-25 DIAGNOSIS — Z79899 Other long term (current) drug therapy: Secondary | ICD-10-CM | POA: Diagnosis not present

## 2017-04-25 DIAGNOSIS — Z7982 Long term (current) use of aspirin: Secondary | ICD-10-CM | POA: Diagnosis not present

## 2017-04-25 DIAGNOSIS — Z888 Allergy status to other drugs, medicaments and biological substances status: Secondary | ICD-10-CM | POA: Diagnosis not present

## 2017-04-25 DIAGNOSIS — J45909 Unspecified asthma, uncomplicated: Secondary | ICD-10-CM | POA: Diagnosis not present

## 2017-04-25 DIAGNOSIS — Z87891 Personal history of nicotine dependence: Secondary | ICD-10-CM | POA: Diagnosis not present

## 2017-04-25 DIAGNOSIS — Z9889 Other specified postprocedural states: Secondary | ICD-10-CM | POA: Diagnosis not present

## 2017-04-25 DIAGNOSIS — Z882 Allergy status to sulfonamides status: Secondary | ICD-10-CM | POA: Diagnosis not present

## 2017-04-25 DIAGNOSIS — I252 Old myocardial infarction: Secondary | ICD-10-CM | POA: Diagnosis not present

## 2017-04-25 DIAGNOSIS — I119 Hypertensive heart disease without heart failure: Secondary | ICD-10-CM | POA: Diagnosis not present

## 2017-04-27 ENCOUNTER — Other Ambulatory Visit: Payer: Self-pay | Admitting: Cardiovascular Disease

## 2017-04-30 DIAGNOSIS — Z79899 Other long term (current) drug therapy: Secondary | ICD-10-CM | POA: Diagnosis not present

## 2017-04-30 DIAGNOSIS — R918 Other nonspecific abnormal finding of lung field: Secondary | ICD-10-CM | POA: Diagnosis not present

## 2017-04-30 DIAGNOSIS — C61 Malignant neoplasm of prostate: Secondary | ICD-10-CM | POA: Diagnosis not present

## 2017-04-30 DIAGNOSIS — Z8546 Personal history of malignant neoplasm of prostate: Secondary | ICD-10-CM | POA: Diagnosis not present

## 2017-04-30 DIAGNOSIS — D696 Thrombocytopenia, unspecified: Secondary | ICD-10-CM | POA: Diagnosis not present

## 2017-04-30 DIAGNOSIS — Z5111 Encounter for antineoplastic chemotherapy: Secondary | ICD-10-CM | POA: Diagnosis not present

## 2017-05-25 ENCOUNTER — Other Ambulatory Visit: Payer: Self-pay | Admitting: Cardiovascular Disease

## 2017-05-26 NOTE — Telephone Encounter (Signed)
Medication Detail    Disp Refills Start End   metoprolol (LOPRESSOR) 50 MG tablet 90 tablet 3 02/14/2017    Sig - Route: Take 0.5 tablets (25 mg total) by mouth 2 (two) times daily. - Oral   Notes to Pharmacy: Discard any prior refills, pt requested 90/3   E-Prescribing Status: Receipt confirmed by pharmacy (02/14/2017 9:10 AM EDT)   Pharmacy   [id number] - [place], [place]

## 2017-06-23 DIAGNOSIS — C61 Malignant neoplasm of prostate: Secondary | ICD-10-CM | POA: Diagnosis not present

## 2017-06-26 DIAGNOSIS — C61 Malignant neoplasm of prostate: Secondary | ICD-10-CM | POA: Diagnosis not present

## 2017-06-26 DIAGNOSIS — Z510 Encounter for antineoplastic radiation therapy: Secondary | ICD-10-CM | POA: Diagnosis not present

## 2017-07-01 DIAGNOSIS — Z510 Encounter for antineoplastic radiation therapy: Secondary | ICD-10-CM | POA: Diagnosis not present

## 2017-07-01 DIAGNOSIS — C61 Malignant neoplasm of prostate: Secondary | ICD-10-CM | POA: Diagnosis not present

## 2017-07-02 DIAGNOSIS — C61 Malignant neoplasm of prostate: Secondary | ICD-10-CM | POA: Diagnosis not present

## 2017-07-02 DIAGNOSIS — Z510 Encounter for antineoplastic radiation therapy: Secondary | ICD-10-CM | POA: Diagnosis not present

## 2017-07-03 DIAGNOSIS — C61 Malignant neoplasm of prostate: Secondary | ICD-10-CM | POA: Diagnosis not present

## 2017-07-03 DIAGNOSIS — Z510 Encounter for antineoplastic radiation therapy: Secondary | ICD-10-CM | POA: Diagnosis not present

## 2017-07-04 DIAGNOSIS — C61 Malignant neoplasm of prostate: Secondary | ICD-10-CM | POA: Diagnosis not present

## 2017-07-04 DIAGNOSIS — Z510 Encounter for antineoplastic radiation therapy: Secondary | ICD-10-CM | POA: Diagnosis not present

## 2017-07-07 DIAGNOSIS — Z510 Encounter for antineoplastic radiation therapy: Secondary | ICD-10-CM | POA: Diagnosis not present

## 2017-07-07 DIAGNOSIS — C61 Malignant neoplasm of prostate: Secondary | ICD-10-CM | POA: Diagnosis not present

## 2017-07-08 DIAGNOSIS — Z510 Encounter for antineoplastic radiation therapy: Secondary | ICD-10-CM | POA: Diagnosis not present

## 2017-07-08 DIAGNOSIS — C61 Malignant neoplasm of prostate: Secondary | ICD-10-CM | POA: Diagnosis not present

## 2017-07-09 DIAGNOSIS — C61 Malignant neoplasm of prostate: Secondary | ICD-10-CM | POA: Diagnosis not present

## 2017-07-09 DIAGNOSIS — Z510 Encounter for antineoplastic radiation therapy: Secondary | ICD-10-CM | POA: Diagnosis not present

## 2017-07-10 DIAGNOSIS — Z510 Encounter for antineoplastic radiation therapy: Secondary | ICD-10-CM | POA: Diagnosis not present

## 2017-07-10 DIAGNOSIS — C61 Malignant neoplasm of prostate: Secondary | ICD-10-CM | POA: Diagnosis not present

## 2017-07-11 DIAGNOSIS — Z510 Encounter for antineoplastic radiation therapy: Secondary | ICD-10-CM | POA: Diagnosis not present

## 2017-07-11 DIAGNOSIS — C61 Malignant neoplasm of prostate: Secondary | ICD-10-CM | POA: Diagnosis not present

## 2017-07-14 DIAGNOSIS — Z510 Encounter for antineoplastic radiation therapy: Secondary | ICD-10-CM | POA: Diagnosis not present

## 2017-07-14 DIAGNOSIS — C61 Malignant neoplasm of prostate: Secondary | ICD-10-CM | POA: Diagnosis not present

## 2017-07-15 DIAGNOSIS — C61 Malignant neoplasm of prostate: Secondary | ICD-10-CM | POA: Diagnosis not present

## 2017-07-15 DIAGNOSIS — Z510 Encounter for antineoplastic radiation therapy: Secondary | ICD-10-CM | POA: Diagnosis not present

## 2017-07-16 DIAGNOSIS — C61 Malignant neoplasm of prostate: Secondary | ICD-10-CM | POA: Diagnosis not present

## 2017-07-16 DIAGNOSIS — Z510 Encounter for antineoplastic radiation therapy: Secondary | ICD-10-CM | POA: Diagnosis not present

## 2017-07-17 DIAGNOSIS — Z510 Encounter for antineoplastic radiation therapy: Secondary | ICD-10-CM | POA: Diagnosis not present

## 2017-07-17 DIAGNOSIS — C61 Malignant neoplasm of prostate: Secondary | ICD-10-CM | POA: Diagnosis not present

## 2017-07-18 DIAGNOSIS — C61 Malignant neoplasm of prostate: Secondary | ICD-10-CM | POA: Diagnosis not present

## 2017-07-18 DIAGNOSIS — Z510 Encounter for antineoplastic radiation therapy: Secondary | ICD-10-CM | POA: Diagnosis not present

## 2017-07-21 DIAGNOSIS — C61 Malignant neoplasm of prostate: Secondary | ICD-10-CM | POA: Diagnosis not present

## 2017-07-21 DIAGNOSIS — Z510 Encounter for antineoplastic radiation therapy: Secondary | ICD-10-CM | POA: Diagnosis not present

## 2017-07-22 DIAGNOSIS — C61 Malignant neoplasm of prostate: Secondary | ICD-10-CM | POA: Diagnosis not present

## 2017-07-22 DIAGNOSIS — Z510 Encounter for antineoplastic radiation therapy: Secondary | ICD-10-CM | POA: Diagnosis not present

## 2017-07-23 DIAGNOSIS — Z510 Encounter for antineoplastic radiation therapy: Secondary | ICD-10-CM | POA: Diagnosis not present

## 2017-07-23 DIAGNOSIS — C61 Malignant neoplasm of prostate: Secondary | ICD-10-CM | POA: Diagnosis not present

## 2017-07-24 DIAGNOSIS — Z510 Encounter for antineoplastic radiation therapy: Secondary | ICD-10-CM | POA: Diagnosis not present

## 2017-07-24 DIAGNOSIS — C61 Malignant neoplasm of prostate: Secondary | ICD-10-CM | POA: Diagnosis not present

## 2017-07-25 DIAGNOSIS — C61 Malignant neoplasm of prostate: Secondary | ICD-10-CM | POA: Diagnosis not present

## 2017-07-25 DIAGNOSIS — Z510 Encounter for antineoplastic radiation therapy: Secondary | ICD-10-CM | POA: Diagnosis not present

## 2017-07-29 DIAGNOSIS — C61 Malignant neoplasm of prostate: Secondary | ICD-10-CM | POA: Diagnosis not present

## 2017-07-29 DIAGNOSIS — Z510 Encounter for antineoplastic radiation therapy: Secondary | ICD-10-CM | POA: Diagnosis not present

## 2017-07-30 DIAGNOSIS — Z510 Encounter for antineoplastic radiation therapy: Secondary | ICD-10-CM | POA: Diagnosis not present

## 2017-07-30 DIAGNOSIS — C61 Malignant neoplasm of prostate: Secondary | ICD-10-CM | POA: Diagnosis not present

## 2017-07-31 DIAGNOSIS — Z510 Encounter for antineoplastic radiation therapy: Secondary | ICD-10-CM | POA: Diagnosis not present

## 2017-07-31 DIAGNOSIS — C61 Malignant neoplasm of prostate: Secondary | ICD-10-CM | POA: Diagnosis not present

## 2017-08-01 DIAGNOSIS — Z510 Encounter for antineoplastic radiation therapy: Secondary | ICD-10-CM | POA: Diagnosis not present

## 2017-08-01 DIAGNOSIS — C61 Malignant neoplasm of prostate: Secondary | ICD-10-CM | POA: Diagnosis not present

## 2017-08-04 DIAGNOSIS — Z510 Encounter for antineoplastic radiation therapy: Secondary | ICD-10-CM | POA: Diagnosis not present

## 2017-08-04 DIAGNOSIS — C61 Malignant neoplasm of prostate: Secondary | ICD-10-CM | POA: Diagnosis not present

## 2017-08-05 DIAGNOSIS — C61 Malignant neoplasm of prostate: Secondary | ICD-10-CM | POA: Diagnosis not present

## 2017-08-05 DIAGNOSIS — Z510 Encounter for antineoplastic radiation therapy: Secondary | ICD-10-CM | POA: Diagnosis not present

## 2017-08-06 DIAGNOSIS — Z510 Encounter for antineoplastic radiation therapy: Secondary | ICD-10-CM | POA: Diagnosis not present

## 2017-08-06 DIAGNOSIS — C61 Malignant neoplasm of prostate: Secondary | ICD-10-CM | POA: Diagnosis not present

## 2017-08-07 DIAGNOSIS — Z510 Encounter for antineoplastic radiation therapy: Secondary | ICD-10-CM | POA: Diagnosis not present

## 2017-08-07 DIAGNOSIS — C61 Malignant neoplasm of prostate: Secondary | ICD-10-CM | POA: Diagnosis not present

## 2017-08-08 DIAGNOSIS — Z510 Encounter for antineoplastic radiation therapy: Secondary | ICD-10-CM | POA: Diagnosis not present

## 2017-08-08 DIAGNOSIS — C61 Malignant neoplasm of prostate: Secondary | ICD-10-CM | POA: Diagnosis not present

## 2017-08-11 DIAGNOSIS — C61 Malignant neoplasm of prostate: Secondary | ICD-10-CM | POA: Diagnosis not present

## 2017-08-11 DIAGNOSIS — H6121 Impacted cerumen, right ear: Secondary | ICD-10-CM | POA: Diagnosis not present

## 2017-08-11 DIAGNOSIS — Z510 Encounter for antineoplastic radiation therapy: Secondary | ICD-10-CM | POA: Diagnosis not present

## 2017-08-11 DIAGNOSIS — J019 Acute sinusitis, unspecified: Secondary | ICD-10-CM | POA: Diagnosis not present

## 2017-08-11 DIAGNOSIS — Z6831 Body mass index (BMI) 31.0-31.9, adult: Secondary | ICD-10-CM | POA: Diagnosis not present

## 2017-08-11 DIAGNOSIS — J208 Acute bronchitis due to other specified organisms: Secondary | ICD-10-CM | POA: Diagnosis not present

## 2017-08-12 DIAGNOSIS — Z510 Encounter for antineoplastic radiation therapy: Secondary | ICD-10-CM | POA: Diagnosis not present

## 2017-08-12 DIAGNOSIS — C61 Malignant neoplasm of prostate: Secondary | ICD-10-CM | POA: Diagnosis not present

## 2017-08-13 DIAGNOSIS — C61 Malignant neoplasm of prostate: Secondary | ICD-10-CM | POA: Diagnosis not present

## 2017-08-13 DIAGNOSIS — Z510 Encounter for antineoplastic radiation therapy: Secondary | ICD-10-CM | POA: Diagnosis not present

## 2017-08-14 DIAGNOSIS — Z510 Encounter for antineoplastic radiation therapy: Secondary | ICD-10-CM | POA: Diagnosis not present

## 2017-08-14 DIAGNOSIS — C61 Malignant neoplasm of prostate: Secondary | ICD-10-CM | POA: Diagnosis not present

## 2017-08-15 DIAGNOSIS — C61 Malignant neoplasm of prostate: Secondary | ICD-10-CM | POA: Diagnosis not present

## 2017-08-15 DIAGNOSIS — Z510 Encounter for antineoplastic radiation therapy: Secondary | ICD-10-CM | POA: Diagnosis not present

## 2017-08-18 DIAGNOSIS — Z510 Encounter for antineoplastic radiation therapy: Secondary | ICD-10-CM | POA: Diagnosis not present

## 2017-08-18 DIAGNOSIS — C61 Malignant neoplasm of prostate: Secondary | ICD-10-CM | POA: Diagnosis not present

## 2017-08-19 DIAGNOSIS — Z510 Encounter for antineoplastic radiation therapy: Secondary | ICD-10-CM | POA: Diagnosis not present

## 2017-08-19 DIAGNOSIS — C61 Malignant neoplasm of prostate: Secondary | ICD-10-CM | POA: Diagnosis not present

## 2017-08-22 ENCOUNTER — Other Ambulatory Visit: Payer: Self-pay | Admitting: Cardiovascular Disease

## 2017-09-05 DIAGNOSIS — Z9181 History of falling: Secondary | ICD-10-CM | POA: Diagnosis not present

## 2017-09-05 DIAGNOSIS — J208 Acute bronchitis due to other specified organisms: Secondary | ICD-10-CM | POA: Diagnosis not present

## 2017-09-05 DIAGNOSIS — Z6830 Body mass index (BMI) 30.0-30.9, adult: Secondary | ICD-10-CM | POA: Diagnosis not present

## 2017-09-16 IMAGING — CT CT HEART MORP W/ CTA COR W/ SCORE W/ CA W/CM &/OR W/O CM
1 of 10 series · 1 of 20 positions shown, 2 images · IV contrast (Iodine)
Comparison: Chest CT 01/11/2009.

CLINICAL DATA: Chest pain

EXAM:
Cardiac CTA
MEDICATIONS:
Sub lingual nitro. 4mg and lopressor 5mg
TECHNIQUE: The patient was scanned on a [employer] [REDACTED]ice scanner. Gantry
rotation speed was 270 msecs. Collimation was .9mm. A 100 kV
prospective scan was triggered in the descending thoracic aorta at
111 HU's with 5% padding centered around 78% of the R-R interval.
Average HR during the scan was 60 bpm. The 3D data set was
interpreted on a dedicated work station using MPR, MIP and VRT
modes. A total of 80cc of contrast was used.

[Series 300: locator · axial · 0.35mm/px · z∈[-148,-148]mm · 1 of 1 slices shown, 2 images]
[im 1/1  vessel]
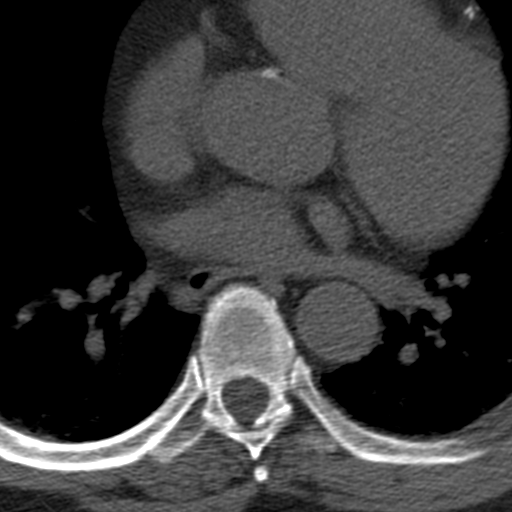
[im 1/1  lung]
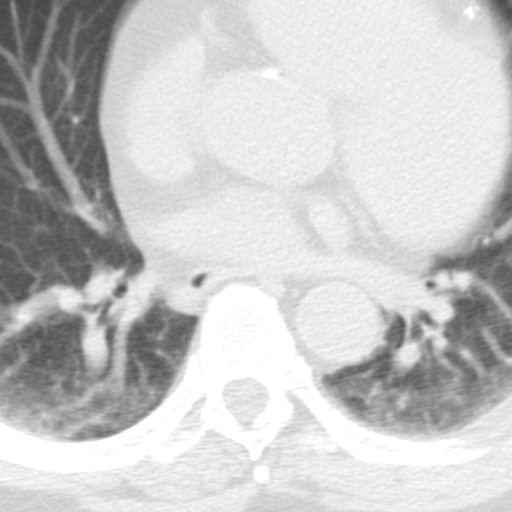

[1 of 20 positions shown; findings below may reference images not displayed]

FINDINGS: Non-cardiac: See separate report from [REDACTED]. No
significant findings on limited lung and soft tissue windows.

Calcium Score:

Coronary Arteries: Right dominant with no anomalies

LM:  Less than 30 % distal calcified stenosis

LAD: Occluded stent in proximal vessel [REDACTED] patent to mid and distal
LAD

IM:  Normal

D1:  30% calcified proximal stenosis

Circumflex:  Small vessel

OM1:  Cannot exclude severe distal calcified stenosis

RCA:  30% calcified stenosis proximal mid and distal

PDA:  30% calcified proximal PDA stenosis

PLA:  Normal
IMPRESSION: 1) Calcium score 7113 excluding stents in LAD 96th percentile for
age and sex

2) Non-obstructive calcified disease in RCA, D1, IM. Cannot exclude
calcified obstructive disease in distal small OM1

3) Occluded proximal LAD at stent with patent [REDACTED] to mid and distal
LAD

Medical Malm would seem most appropriate at this time especially if
chest pain is atypical

Korneliu Geng

EXAM:
OVER-READ INTERPRETATION  CT CHEST

The following report is an over-read performed by radiologist Dr.
over-read does not include interpretation of cardiac or coronary
anatomy or pathology. The coronary calcium score/coronary CTA
interpretation by the cardiologist is attached.
FINDINGS: Within the visualized portions of the thorax there are no suspicious
appearing pulmonary nodules or masses, there is no acute
consolidative airspace disease, no pleural effusions, no
pneumothorax and no lymphadenopathy. Diffuse low attenuation
throughout the hepatic parenchyma, compatible with hepatic
steatosis. Status post cholecystectomy. 3.1 cm low-attenuation
lesion in segment 4A of the liver is compatible with a simple cyst.
IMPRESSION: 1. Hepatic steatosis.
2. No acute incidental noncardiac findings noted.

## 2017-09-22 DIAGNOSIS — Z6830 Body mass index (BMI) 30.0-30.9, adult: Secondary | ICD-10-CM | POA: Diagnosis not present

## 2017-09-22 DIAGNOSIS — J208 Acute bronchitis due to other specified organisms: Secondary | ICD-10-CM | POA: Diagnosis not present

## 2017-09-22 DIAGNOSIS — J209 Acute bronchitis, unspecified: Secondary | ICD-10-CM | POA: Diagnosis not present

## 2017-10-23 DIAGNOSIS — K219 Gastro-esophageal reflux disease without esophagitis: Secondary | ICD-10-CM | POA: Diagnosis not present

## 2017-10-23 DIAGNOSIS — J019 Acute sinusitis, unspecified: Secondary | ICD-10-CM | POA: Diagnosis not present

## 2017-10-23 DIAGNOSIS — J208 Acute bronchitis due to other specified organisms: Secondary | ICD-10-CM | POA: Diagnosis not present

## 2017-10-31 DIAGNOSIS — Z191 Hormone sensitive malignancy status: Secondary | ICD-10-CM | POA: Diagnosis not present

## 2017-10-31 DIAGNOSIS — Z923 Personal history of irradiation: Secondary | ICD-10-CM | POA: Diagnosis not present

## 2017-10-31 DIAGNOSIS — C61 Malignant neoplasm of prostate: Secondary | ICD-10-CM | POA: Diagnosis not present

## 2018-01-09 DIAGNOSIS — J019 Acute sinusitis, unspecified: Secondary | ICD-10-CM | POA: Diagnosis not present

## 2018-01-09 DIAGNOSIS — Z6831 Body mass index (BMI) 31.0-31.9, adult: Secondary | ICD-10-CM | POA: Diagnosis not present

## 2018-01-30 ENCOUNTER — Other Ambulatory Visit: Payer: Self-pay | Admitting: Cardiovascular Disease

## 2018-02-13 ENCOUNTER — Encounter: Payer: Self-pay | Admitting: Cardiovascular Disease

## 2018-02-17 DIAGNOSIS — Z1331 Encounter for screening for depression: Secondary | ICD-10-CM | POA: Diagnosis not present

## 2018-02-17 DIAGNOSIS — E669 Obesity, unspecified: Secondary | ICD-10-CM | POA: Diagnosis not present

## 2018-02-17 DIAGNOSIS — R5381 Other malaise: Secondary | ICD-10-CM | POA: Diagnosis not present

## 2018-02-17 DIAGNOSIS — Z6830 Body mass index (BMI) 30.0-30.9, adult: Secondary | ICD-10-CM | POA: Diagnosis not present

## 2018-02-17 DIAGNOSIS — R5383 Other fatigue: Secondary | ICD-10-CM | POA: Diagnosis not present

## 2018-02-17 DIAGNOSIS — Z0000 Encounter for general adult medical examination without abnormal findings: Secondary | ICD-10-CM | POA: Diagnosis not present

## 2018-02-17 DIAGNOSIS — Z9481 Bone marrow transplant status: Secondary | ICD-10-CM | POA: Diagnosis not present

## 2018-02-17 DIAGNOSIS — Z136 Encounter for screening for cardiovascular disorders: Secondary | ICD-10-CM | POA: Diagnosis not present

## 2018-02-17 DIAGNOSIS — E785 Hyperlipidemia, unspecified: Secondary | ICD-10-CM | POA: Diagnosis not present

## 2018-02-17 DIAGNOSIS — Z9181 History of falling: Secondary | ICD-10-CM | POA: Diagnosis not present

## 2018-02-17 DIAGNOSIS — Z125 Encounter for screening for malignant neoplasm of prostate: Secondary | ICD-10-CM | POA: Diagnosis not present

## 2018-02-17 DIAGNOSIS — D510 Vitamin B12 deficiency anemia due to intrinsic factor deficiency: Secondary | ICD-10-CM | POA: Diagnosis not present

## 2018-02-19 NOTE — Progress Notes (Signed)
Patient ID: [name], male   DOB: 08-14-1946, 72 y.o.   MRN: [id number]   72 y.o. with CAD. Failed angioplasty to LAD with wire perforation requiring single vessel LIMA to LAD 2002. Normal EF by echo in 2011 Normal myovue in 2015. Biggest issue is HLD. Intolerant to statins with chronic mild elevation in LFTls ? Fatty liver. 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  All other systems reviewed and negative  General: BP 126/76   Pulse 69   Ht 6' (1.829 m)   Wt 223 lb 8 oz (101.4 kg)   SpO2 97%   BMI 30.31 kg/m  Affect appropriate Healthy:  appears stated age 37: normal Neck supple with no adenopathy JVP normal no bruits no thyromegaly Lungs clear with no wheezing and good diaphragmatic motion Heart:  S1/S2 no murmur, no rub, gallop or click PMI normal post sternotomy Abdomen: benighn, BS positve, no tenderness, no AAA no bruit.  No HSM or HJR Distal pulses intact with no bruits No edema Neuro non-focal Skin warm and dry No muscular weakness    Current Outpatient Medications  Medication Sig Dispense Refill  . Ascorbic Acid (VITAMIN C PO) Take 1 tablet by mouth daily.    Marland Kitchen aspirin 81 MG tablet Take 81 mg by mouth daily.     . Cholecalciferol (VITAMIN D-3) 1000 UNITS CAPS Take 1 capsule by mouth daily.    . Coenzyme Q10 (COQ-10 PO) Take 1 tablet by mouth daily.    . enalapril (VASOTEC)  20 MG tablet Take 1/2 tablet by mouth every day in the morning and 1 tablet by mouth in the evening 135 tablet 0  . fexofenadine (ALLEGRA) 180 MG tablet Take 180 mg by mouth daily as needed for allergies or rhinitis.    . fish oil-omega-3 fatty acids 1000 MG capsule Take 2 g by mouth 2 (two) times daily.     . metoprolol (LOPRESSOR) 50 MG tablet Take 0.5 tablets (25 mg total) by mouth 2 (two) times daily. 90 tablet 3  . Multiple Vitamin (MULTIVITAMIN WITH MINERALS) TABS tablet Take 1 tablet by mouth daily.    . nitroGLYCERIN (NITROSTAT) 0.4 MG SL tablet Place 1 tablet (0.4 mg total) under the tongue every 5 (five) minutes as needed for chest pain (CP or SOB). 25 tablet 3  . ranitidine (ZANTAC) 300 MG tablet Take 300 mg by mouth every evening.    Marland Kitchen spironolactone-hydrochlorothiazide (ALDACTAZIDE) 25-25 MG tablet TAKE 1/2 TABLET BY MOUTH EVERY DAY 45 tablet 1   No current facility-administered medications for this visit.     Allergies  Codeine; Nabumetone; Oxycodone; Statins; Zolpidem tartrate; and Sulfamethoxazole-trimethoprim  Electrocardiogram:  6/14  SR rate 69  Normal  12/20/14  NSR rate 69 normal no change  02/25/18 SR rate 69 nonspecific ST changes   Assessment and Plan CAD:  Single vessel CABG  [place] to LAD after wire perforation 2002  Normal myovue in [place] June 2017  Chol:  Intolerant to statins  Zetia copay too expensive   Not candidate CLEAR trial due to elevated LFT;s will repeat may be candidate for [facility] trial in Fall LDL 157 March  Lab Results  Component Value Date   ALT 79 (H) 02/14/2017   AST 56 (H) 02/14/2017   ALKPHOS 63 02/14/2017   BILITOT 0.3 02/14/2017    Lab Results  Component Value Date   LDLCALC 157 (H) 02/14/2017     HTN:  Well controlled.  Continue current medications and low sodium Dash type diet.   Prostate CA:  F/u [facility] hormone Rx causing night sweats and fatigue will likely need Maintenance Lupron.   [name]

## 2018-02-25 ENCOUNTER — Ambulatory Visit (INDEPENDENT_AMBULATORY_CARE_PROVIDER_SITE_OTHER): Payer: Medicare Other | Admitting: Cardiovascular Disease

## 2018-02-25 ENCOUNTER — Encounter: Payer: Self-pay | Admitting: Cardiovascular Disease

## 2018-02-25 VITALS — BP 126/76 | HR 69 | Ht 72.0 in | Wt 223.5 lb

## 2018-02-25 DIAGNOSIS — I10 Essential (primary) hypertension: Secondary | ICD-10-CM | POA: Diagnosis not present

## 2018-02-25 DIAGNOSIS — E785 Hyperlipidemia, unspecified: Secondary | ICD-10-CM | POA: Diagnosis not present

## 2018-02-25 DIAGNOSIS — I25700 Atherosclerosis of coronary artery bypass graft(s), unspecified, with unstable angina pectoris: Secondary | ICD-10-CM

## 2018-02-25 NOTE — Patient Instructions (Addendum)
Medication Instructions:  Your physician recommends that you continue on your current medications as directed. Please refer to the Current Medication list given to you today.  Labwork: NONE  Testing/Procedures: NONE  Follow-Up: Your physician wants you to follow-up in: 12 months with Dr. Johnsie Cancel. You will receive a reminder letter in the mail two months in advance. If you don't receive a letter, please call our office to schedule the follow-up appointment.   If you need a refill on your cardiac medications before your next appointment, please call your pharmacy.    DASH Eating Plan DASH stands for "Dietary Approaches to Stop Hypertension." The DASH eating plan is a healthy eating plan that has been shown to reduce high blood pressure (hypertension). It may also reduce your risk for type 2 diabetes, heart disease, and stroke. The DASH eating plan may also help with weight loss. What are tips for following this plan? General guidelines  Avoid eating more than 2,300 mg (milligrams) of salt (sodium) a day. If you have hypertension, you may need to reduce your sodium intake to 1,500 mg a day.  Limit alcohol intake to no more than 1 drink a day for nonpregnant women and 2 drinks a day for men. One drink equals 12 oz of beer, 5 oz of wine, or 1 oz of hard liquor.  Work with your health care provider to maintain a healthy body weight or to lose weight. Ask what an ideal weight is for you.  Get at least 30 minutes of exercise that causes your heart to beat faster (aerobic exercise) most days of the week. Activities may include walking, swimming, or biking.  Work with your health care provider or diet and nutrition specialist (dietitian) to adjust your eating plan to your individual calorie needs. Reading food labels  Check food labels for the amount of sodium per serving. Choose foods with less than 5 percent of the Daily Value of sodium. Generally, foods with less than 300 mg of sodium per  serving fit into this eating plan.  To find whole grains, look for the word "whole" as the first word in the ingredient list. Shopping  Buy products labeled as "low-sodium" or "no salt added."  Buy fresh foods. Avoid canned foods and premade or frozen meals. Cooking  Avoid adding salt when cooking. Use salt-free seasonings or herbs instead of table salt or sea salt. Check with your health care provider or pharmacist before using salt substitutes.  Do not fry foods. Cook foods using healthy methods such as baking, boiling, grilling, and broiling instead.  Cook with heart-healthy oils, such as olive, canola, soybean, or sunflower oil. Meal planning   Eat a balanced diet that includes: ? 5 or more servings of fruits and vegetables each day. At each meal, try to fill half of your plate with fruits and vegetables. ? Up to 6-8 servings of whole grains each day. ? Less than 6 oz of lean meat, poultry, or fish each day. A 3-oz serving of meat is about the same size as a deck of cards. One egg equals 1 oz. ? 2 servings of low-fat dairy each day. ? A serving of nuts, seeds, or beans 5 times each week. ? Heart-healthy fats. Healthy fats called Omega-3 fatty acids are found in foods such as flaxseeds and coldwater fish, like sardines, salmon, and mackerel.  Limit how much you eat of the following: ? Canned or prepackaged foods. ? Food that is high in trans fat, such as fried foods. ?  Food that is high in saturated fat, such as fatty meat. ? Sweets, desserts, sugary drinks, and other foods with added sugar. ? Full-fat dairy products.  Do not salt foods before eating.  Try to eat at least 2 vegetarian meals each week.  Eat more home-cooked food and less restaurant, buffet, and fast food.  When eating at a restaurant, ask that your food be prepared with less salt or no salt, if possible. What foods are recommended? The items listed may not be a complete list. Talk with your dietitian about  what dietary choices are best for you. Grains Whole-grain or whole-wheat bread. Whole-grain or whole-wheat pasta. Brown rice. Modena Morrow. Bulgur. Whole-grain and low-sodium cereals. Pita bread. Low-fat, low-sodium crackers. Whole-wheat flour tortillas. Vegetables Fresh or frozen vegetables (raw, steamed, roasted, or grilled). Low-sodium or reduced-sodium tomato and vegetable juice. Low-sodium or reduced-sodium tomato sauce and tomato paste. Low-sodium or reduced-sodium canned vegetables. Fruits All fresh, dried, or frozen fruit. Canned fruit in natural juice (without added sugar). Meat and other protein foods Skinless chicken or [place]. Ground chicken or [place]. Pork with fat trimmed off. Fish and seafood. Egg whites. Dried beans, peas, or lentils. Unsalted nuts, nut butters, and seeds. Unsalted canned beans. Lean cuts of beef with fat trimmed off. Low-sodium, lean deli meat. Dairy Low-fat (1%) or fat-free (skim) milk. Fat-free, low-fat, or reduced-fat cheeses. Nonfat, low-sodium ricotta or cottage cheese. Low-fat or nonfat yogurt. Low-fat, low-sodium cheese. Fats and oils Soft margarine without trans fats. Vegetable oil. Low-fat, reduced-fat, or light mayonnaise and salad dressings (reduced-sodium). Canola, safflower, olive, soybean, and sunflower oils. Avocado. Seasoning and other foods Herbs. Spices. Seasoning mixes without salt. Unsalted popcorn and pretzels. Fat-free sweets. What foods are not recommended? The items listed may not be a complete list. Talk with your dietitian about what dietary choices are best for you. Grains Baked goods made with fat, such as croissants, muffins, or some breads. Dry pasta or rice meal packs. Vegetables Creamed or fried vegetables. Vegetables in a cheese sauce. Regular canned vegetables (not low-sodium or reduced-sodium). Regular canned tomato sauce and paste (not low-sodium or reduced-sodium). Regular tomato and vegetable juice (not low-sodium or  reduced-sodium). Angie Fava. Olives. Fruits Canned fruit in a light or heavy syrup. Fried fruit. Fruit in cream or butter sauce. Meat and other protein foods Fatty cuts of meat. Ribs. Fried meat. Berniece Salines. Sausage. Bologna and other processed lunch meats. Salami. Fatback. Hotdogs. Bratwurst. Salted nuts and seeds. Canned beans with added salt. Canned or smoked fish. Whole eggs or egg yolks. Chicken or [place] with skin. Dairy Whole or 2% milk, cream, and half-and-half. Whole or full-fat cream cheese. Whole-fat or sweetened yogurt. Full-fat cheese. Nondairy creamers. Whipped toppings. Processed cheese and cheese spreads. Fats and oils Butter. Stick margarine. Lard. Shortening. Ghee. Bacon fat. Tropical oils, such as coconut, palm kernel, or palm oil. Seasoning and other foods Salted popcorn and pretzels. Onion salt, garlic salt, seasoned salt, table salt, and sea salt. Worcestershire sauce. Tartar sauce. Barbecue sauce. Teriyaki sauce. Soy sauce, including reduced-sodium. Steak sauce. Canned and packaged gravies. Fish sauce. Oyster sauce. Cocktail sauce. Horseradish that you find on the shelf. Ketchup. Mustard. Meat flavorings and tenderizers. Bouillon cubes. Hot sauce and Tabasco sauce. Premade or packaged marinades. Premade or packaged taco seasonings. Relishes. Regular salad dressings. Where to find more information:  National Heart, Lung, and [place]: https://wilson-eaton.com/  American Heart Association: www.heart.org Summary  The DASH eating plan is a healthy eating plan that has been shown to reduce high blood  pressure (hypertension). It may also reduce your risk for type 2 diabetes, heart disease, and stroke.  With the DASH eating plan, you should limit salt (sodium) intake to 2,300 mg a day. If you have hypertension, you may need to reduce your sodium intake to 1,500 mg a day.  When on the DASH eating plan, aim to eat more fresh fruits and vegetables, whole grains, lean proteins, low-fat  dairy, and heart-healthy fats.  Work with your health care provider or diet and nutrition specialist (dietitian) to adjust your eating plan to your individual calorie needs. This information is not intended to replace advice given to you by your health care provider. Make sure you discuss any questions you have with your health care provider. Document Released: 10/31/2011 Document Revised: 11/04/2016 Document Reviewed: 11/04/2016 Elsevier Interactive Patient Education  [employer].

## 2018-02-28 ENCOUNTER — Other Ambulatory Visit: Payer: Self-pay | Admitting: Cardiovascular Disease

## 2018-03-27 ENCOUNTER — Other Ambulatory Visit: Payer: Self-pay | Admitting: Cardiovascular Disease

## 2018-03-27 MED ORDER — METOPROLOL TARTRATE 50 MG PO TABS: 25.0000 mg | ORAL_TABLET | Freq: Two times a day (BID) | ORAL | 3 refills | Status: DC

## 2018-03-27 MED ORDER — ENALAPRIL MALEATE 20 MG PO TABS: ORAL_TABLET | ORAL | 3 refills | Status: DC

## 2018-03-27 NOTE — Telephone Encounter (Signed)
Pt's medication was sent to pt's pharmacy as requested. Confirmation received.  °

## 2018-03-31 ENCOUNTER — Telehealth: Payer: Self-pay | Admitting: Cardiovascular Disease

## 2018-03-31 NOTE — Telephone Encounter (Signed)
New message     *STAT* If patient is at the pharmacy, call can be transferred to refill team.   1. Which medications need to be refilled? (please list name of each medication and dose if known) enalapril (VASOTEC) 20 MG tablet  2. Which pharmacy/location (including street and city if local pharmacy) is medication to be sent to? [facility] [place] [address] [phone]  3. Do they need a 30 day or 90 day supply? 30mg 

## 2018-04-07 DIAGNOSIS — R972 Elevated prostate specific antigen [PSA]: Secondary | ICD-10-CM | POA: Diagnosis not present

## 2018-05-01 DIAGNOSIS — Z8546 Personal history of malignant neoplasm of prostate: Secondary | ICD-10-CM | POA: Diagnosis not present

## 2018-05-01 DIAGNOSIS — Z79899 Other long term (current) drug therapy: Secondary | ICD-10-CM | POA: Diagnosis not present

## 2018-05-01 DIAGNOSIS — C61 Malignant neoplasm of prostate: Secondary | ICD-10-CM | POA: Diagnosis not present

## 2018-05-01 DIAGNOSIS — R0609 Other forms of dyspnea: Secondary | ICD-10-CM | POA: Diagnosis not present

## 2018-07-14 DIAGNOSIS — Z87891 Personal history of nicotine dependence: Secondary | ICD-10-CM | POA: Diagnosis not present

## 2018-07-14 DIAGNOSIS — Z974 Presence of external hearing-aid: Secondary | ICD-10-CM | POA: Diagnosis not present

## 2018-07-14 DIAGNOSIS — B369 Superficial mycosis, unspecified: Secondary | ICD-10-CM | POA: Diagnosis not present

## 2018-07-14 DIAGNOSIS — H6241 Otitis externa in other diseases classified elsewhere, right ear: Secondary | ICD-10-CM | POA: Diagnosis not present

## 2018-07-14 DIAGNOSIS — H903 Sensorineural hearing loss, bilateral: Secondary | ICD-10-CM | POA: Diagnosis not present

## 2018-07-20 DIAGNOSIS — H6241 Otitis externa in other diseases classified elsewhere, right ear: Secondary | ICD-10-CM | POA: Diagnosis not present

## 2018-07-20 DIAGNOSIS — H903 Sensorineural hearing loss, bilateral: Secondary | ICD-10-CM | POA: Diagnosis not present

## 2018-07-20 DIAGNOSIS — B369 Superficial mycosis, unspecified: Secondary | ICD-10-CM | POA: Diagnosis not present

## 2018-07-24 DIAGNOSIS — J019 Acute sinusitis, unspecified: Secondary | ICD-10-CM | POA: Diagnosis not present

## 2018-10-02 IMAGING — DX DG CHEST 2V
2 series · 2 of 2 positions shown · non-contrast
Comparison: 01/15/2016

CLINICAL DATA: Tingling in the left arm

EXAM:
CHEST  2 VIEW

[chest pa]
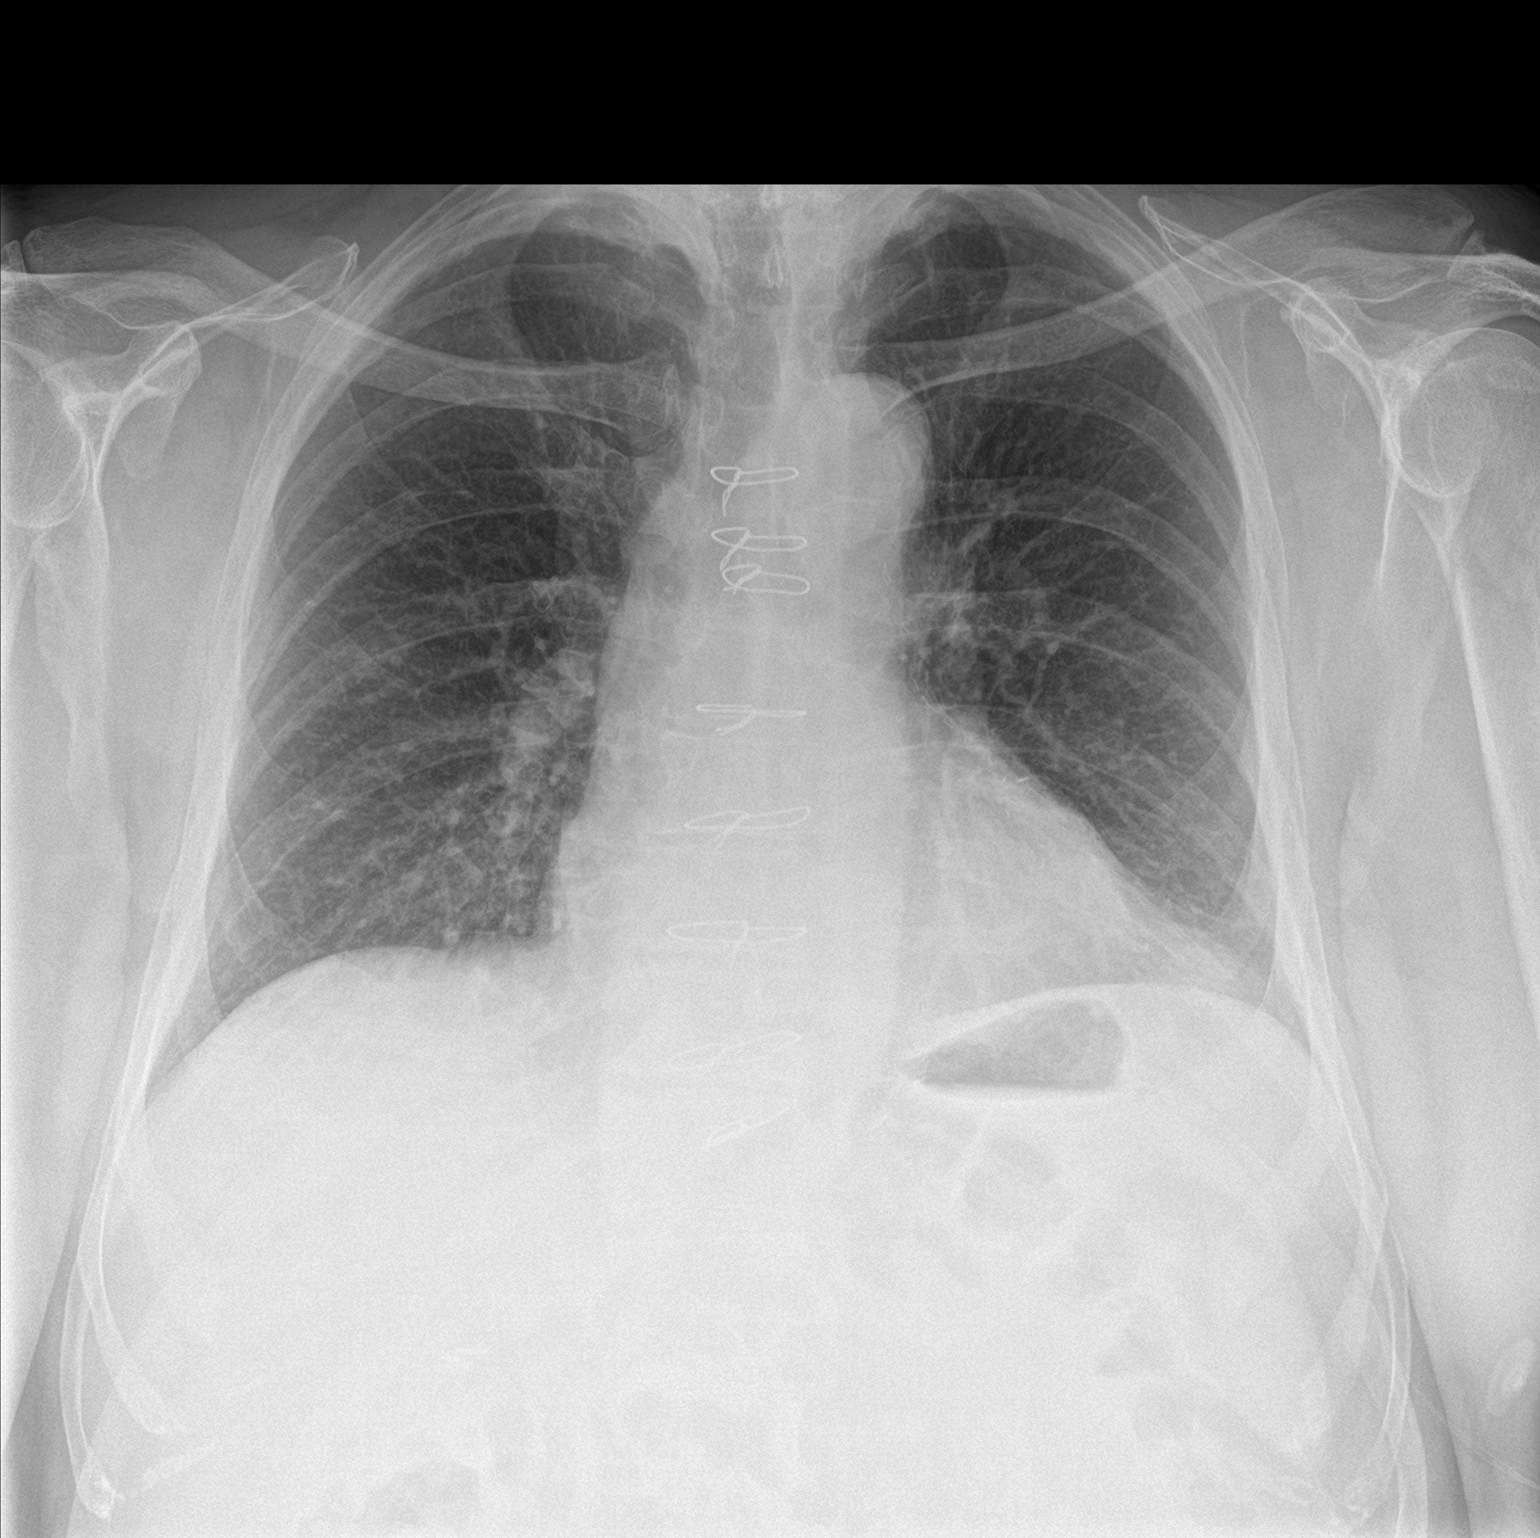

[chest lat]
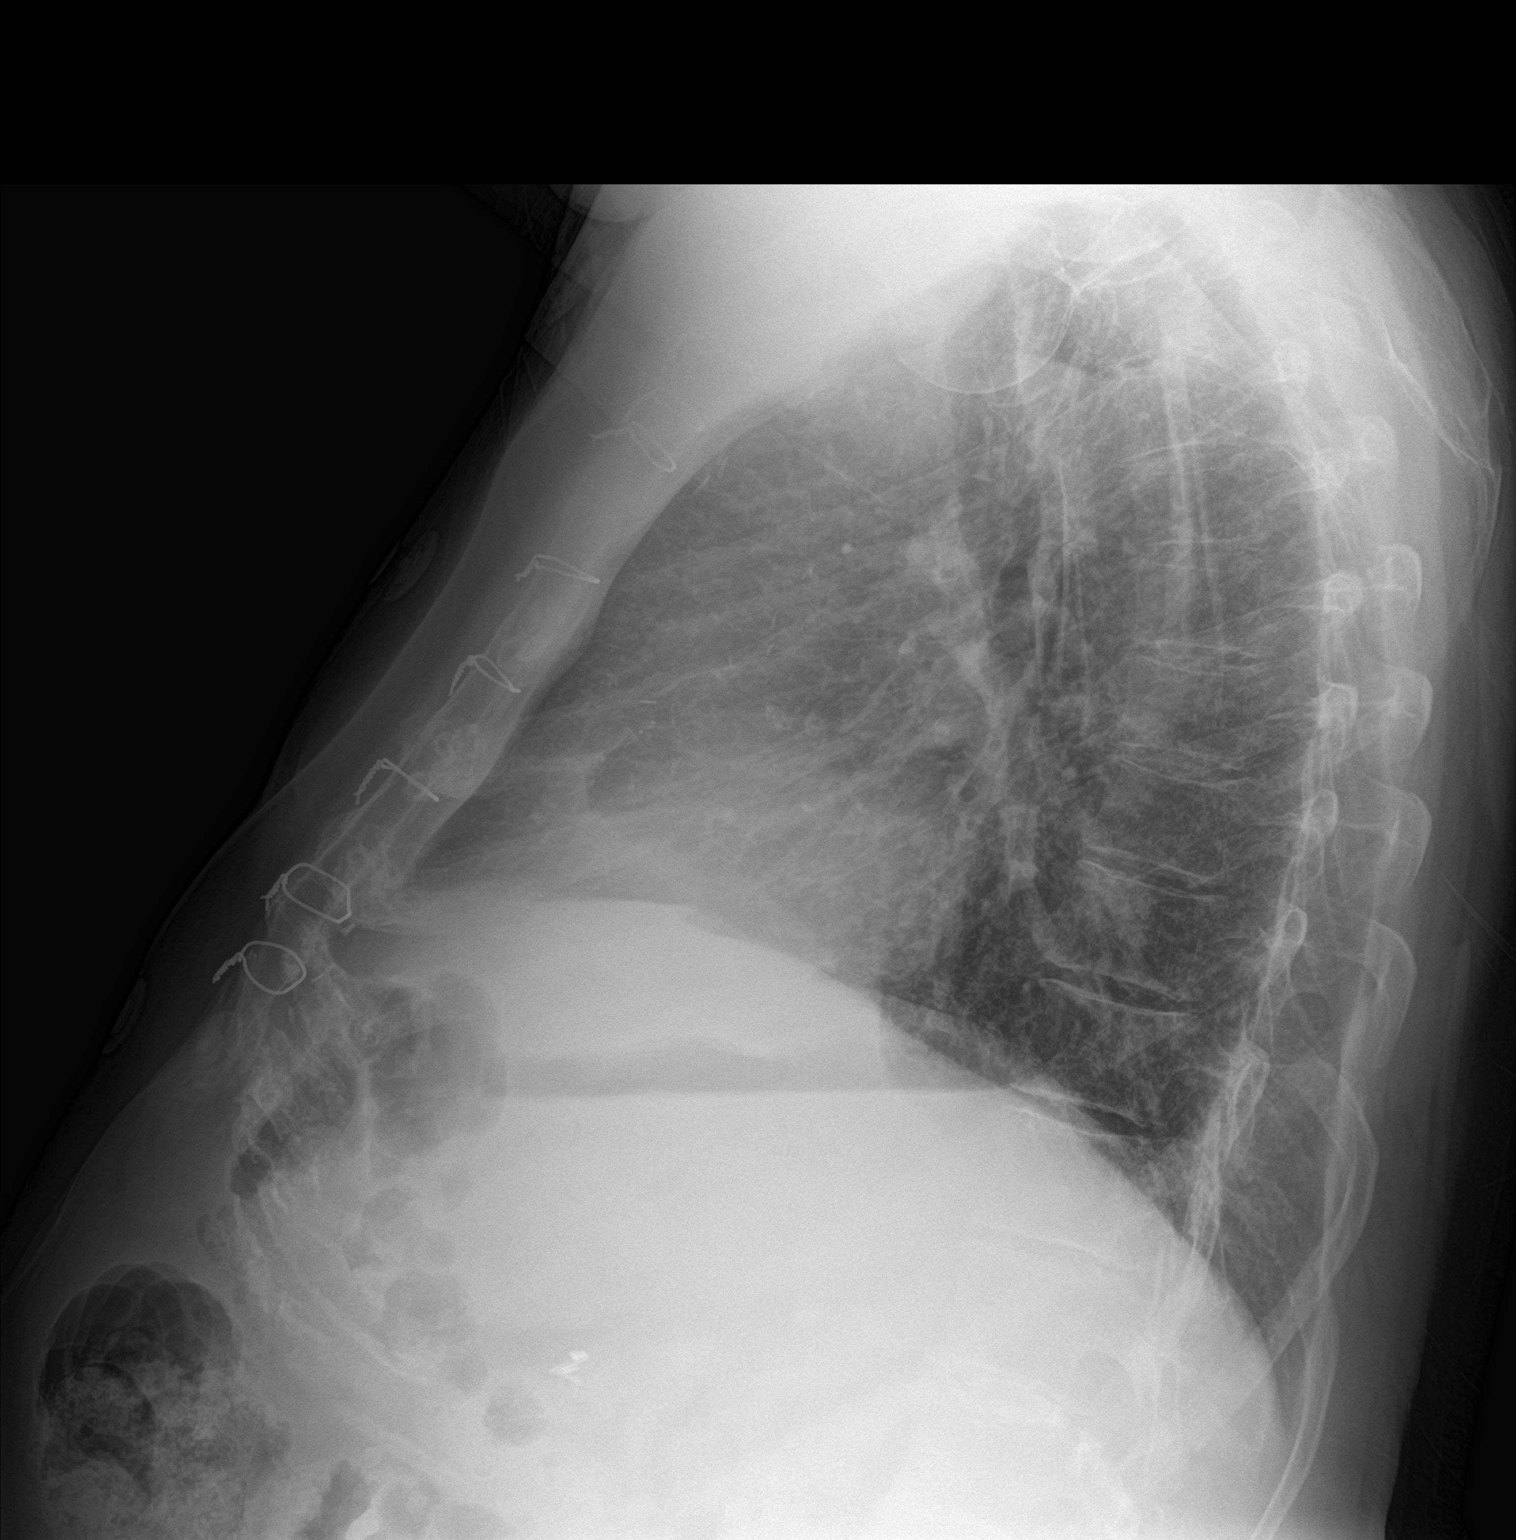

[2 of 2 positions shown; findings below may reference images not displayed]

FINDINGS: Post sternotomy changes. No acute pulmonary infiltrate,
consolidation, or pleural effusion. Stable borderline heart size.
Atherosclerosis. No pneumothorax. Surgical clips in the right upper
quadrant.
IMPRESSION: No active cardiopulmonary disease.

## 2018-10-13 DIAGNOSIS — Z6830 Body mass index (BMI) 30.0-30.9, adult: Secondary | ICD-10-CM | POA: Diagnosis not present

## 2018-10-13 DIAGNOSIS — J208 Acute bronchitis due to other specified organisms: Secondary | ICD-10-CM | POA: Diagnosis not present

## 2018-10-13 DIAGNOSIS — F5104 Psychophysiologic insomnia: Secondary | ICD-10-CM | POA: Diagnosis not present

## 2018-11-06 DIAGNOSIS — R232 Flushing: Secondary | ICD-10-CM | POA: Diagnosis not present

## 2018-11-06 DIAGNOSIS — C61 Malignant neoplasm of prostate: Secondary | ICD-10-CM | POA: Diagnosis not present

## 2018-11-06 DIAGNOSIS — R300 Dysuria: Secondary | ICD-10-CM | POA: Diagnosis not present

## 2018-11-06 DIAGNOSIS — Z79899 Other long term (current) drug therapy: Secondary | ICD-10-CM | POA: Diagnosis not present

## 2018-12-01 DIAGNOSIS — C61 Malignant neoplasm of prostate: Secondary | ICD-10-CM | POA: Diagnosis not present

## 2018-12-01 DIAGNOSIS — Z9079 Acquired absence of other genital organ(s): Secondary | ICD-10-CM | POA: Diagnosis not present

## 2018-12-01 DIAGNOSIS — R300 Dysuria: Secondary | ICD-10-CM | POA: Diagnosis not present

## 2018-12-22 ENCOUNTER — Emergency Department (HOSPITAL_COMMUNITY)
Admission: EM | Admit: 2018-12-22 | Discharge: 2018-12-23 | Disposition: A | Discharge status: Home / Self Care | Payer: Medicare Other | Attending: Emergency Medicine | Admitting: Emergency Medicine

## 2018-12-22 ENCOUNTER — Encounter (HOSPITAL_COMMUNITY): Payer: Self-pay

## 2018-12-22 ENCOUNTER — Emergency Department (HOSPITAL_COMMUNITY): Payer: Medicare Other

## 2018-12-22 ENCOUNTER — Other Ambulatory Visit: Payer: Self-pay

## 2018-12-22 DIAGNOSIS — R0602 Shortness of breath: Secondary | ICD-10-CM | POA: Diagnosis not present

## 2018-12-22 DIAGNOSIS — R05 Cough: Secondary | ICD-10-CM | POA: Diagnosis not present

## 2018-12-22 DIAGNOSIS — Z87891 Personal history of nicotine dependence: Secondary | ICD-10-CM | POA: Insufficient documentation

## 2018-12-22 DIAGNOSIS — Z951 Presence of aortocoronary bypass graft: Secondary | ICD-10-CM | POA: Diagnosis not present

## 2018-12-22 DIAGNOSIS — I10 Essential (primary) hypertension: Secondary | ICD-10-CM | POA: Insufficient documentation

## 2018-12-22 DIAGNOSIS — R202 Paresthesia of skin: Secondary | ICD-10-CM | POA: Insufficient documentation

## 2018-12-22 DIAGNOSIS — Z8546 Personal history of malignant neoplasm of prostate: Secondary | ICD-10-CM | POA: Insufficient documentation

## 2018-12-22 DIAGNOSIS — R531 Weakness: Secondary | ICD-10-CM | POA: Insufficient documentation

## 2018-12-22 DIAGNOSIS — Z96643 Presence of artificial hip joint, bilateral: Secondary | ICD-10-CM | POA: Diagnosis not present

## 2018-12-22 DIAGNOSIS — R42 Dizziness and giddiness: Secondary | ICD-10-CM | POA: Diagnosis not present

## 2018-12-22 DIAGNOSIS — I2510 Atherosclerotic heart disease of native coronary artery without angina pectoris: Secondary | ICD-10-CM | POA: Diagnosis not present

## 2018-12-22 DIAGNOSIS — R200 Anesthesia of skin: Secondary | ICD-10-CM | POA: Insufficient documentation

## 2018-12-22 DIAGNOSIS — Z955 Presence of coronary angioplasty implant and graft: Secondary | ICD-10-CM | POA: Insufficient documentation

## 2018-12-22 LAB — BASIC METABOLIC PANEL
ANION GAP: 10 (ref 5–15)
BUN: 11 mg/dL (ref 8–23)
CALCIUM: 9.2 mg/dL (ref 8.9–10.3)
CO2: 25 mmol/L (ref 22–32)
Chloride: 103 mmol/L (ref 98–111)
Creatinine, Ser: 0.8 mg/dL (ref 0.61–1.24)
GFR calc non Af Amer: >60 mL/min (ref >60)
GLUCOSE: 131 mg/dL — AB (ref 70–99)
Potassium: 3.7 mmol/L (ref 3.5–5.1)
Sodium: 138 mmol/L (ref 135–145)

## 2018-12-22 LAB — CBC
HEMATOCRIT: 40.8 % (ref 39.0–52.0)
HEMOGLOBIN: 13.6 g/dL (ref 13.0–17.0)
MCH: 32.9 pg (ref 26.0–34.0)
MCHC: 33.3 g/dL (ref 30.0–36.0)
MCV: 98.8 fL (ref 80.0–100.0)
NRBC: 0 % (ref 0.0–0.2)
PLATELETS: 172 10*3/uL (ref 150–400)
RBC: 4.13 MIL/uL — AB (ref 4.22–5.81)
RDW: 12.7 % (ref 11.5–15.5)
WBC: 9.4 10*3/uL (ref 4.0–10.5)

## 2018-12-22 LAB — I-STAT TROPONIN, ED: Troponin i, poc: 0 ng/mL (ref 0.00–0.08)

## 2018-12-22 LAB — TROPONIN I: Troponin I: <0.03 ng/mL (ref <0.03)

## 2018-12-22 MED ORDER — SODIUM CHLORIDE 0.9% FLUSH
3.0000 mL | Freq: Once | INTRAVENOUS | Status: AC
  Administered 2018-12-22: 3 mL via INTRAVENOUS

## 2018-12-22 MED ORDER — IOPAMIDOL (ISOVUE-370) INJECTION 76%
INTRAVENOUS | Status: AC
  Administered 2018-12-22: 20:00:00
  Filled 2018-12-22: qty 100

## 2018-12-22 NOTE — Consult Note (Signed)
Cardiology Consultation Note    Patient ID: [name], MRN: [id number], DOB/AGE: 1946-01-29 73 y.o. Admit date: 12/22/2018   Date of Consult: 12/22/2018 Primary Physician: Nicoletta Dress, MD Primary Cardiologist: Johnsie Cancel  Chief Complaint: Arm tingling Reason for Consultation: SOB Requesting MD: Dr. Dayna Barker  HPI: [name] is a 73 y.o. male with a history of CAD (s/p CABG x1), HL, HTN, OSA, prostate cancer who presents to the emergency room with 1 day of intermittent arm tingling.  Patient states that he was helping a friend put up a loft and had his arms elevated above his head for prolonged period yesterday.  Since yesterday afternoon he is noticed intermittent arm tingling radiating from his elbow down to his fingertips.  States he has no pain in his upper arm nor his chest or back.  States that the pain is tingling like and unrelated to exertion.  He states that he has had some shortness of breath when walking up hills that has been ongoing for the past 1-1/2 years.  He states that this is not substantially changed over this period.  He specifically denies chest pain, nausea, vomiting, diaphoresis, PND, orthopnea, lower extremity edema.  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  He states that given his cardiac history, he takes his symptoms very seriously and wanted to make sure he was not having a cardiac event.  Past Medical History:  Diagnosis Date  . Acid reflux   . CAD (coronary artery disease)    a. s/p CABG x 1 (LIMA->LAD);  b. 04/2013 Ex MV: Ex time 29mins, no isch/infarct, EF 60%.  Marland Kitchen HTN (hypertension)   . Hyperlipidemia    intolerant of all statins due to muscle aches  . OSA (obstructive sleep apnea)    resolved with weight loss  . Prostate cancer [facility])    s/p resection  . S/P colonoscopy with polypectomy       Surgical History:  Past Surgical History:    Procedure Laterality Date  . CORONARY ARTERY BYPASS GRAFT  2002  . CORONARY STENT PLACEMENT    . TOTAL HIP ARTHROPLASTY Bilateral      Home Meds: Prior to Admission medications   Medication Sig Start Date End Date Taking? Authorizing Provider  aspirin 81 MG tablet Take 81 mg by mouth daily.    Yes [provider]  Cholecalciferol (VITAMIN D-3) 125 MCG (5000 UT) TABS Take 1 capsule by mouth daily.    Yes [provider]  enalapril (VASOTEC) 20 MG tablet Take 1/2 tablet by mouth every day in the morning and 1 tablet by mouth in the evening Patient taking differently: Take 10-20 mg by mouth See admin instructions. Take 10mg  by mouth every day in the morning and 20mg  by mouth in the evening 03/27/18  Yes Josue Hector, MD  fexofenadine (ALLEGRA) 180 MG tablet Take 180 mg by mouth daily as needed for allergies or rhinitis.   Yes [provider]  fish oil-omega-3 fatty acids 1000 MG capsule Take 2 g by mouth 2 (two) times daily.    Yes [provider]  metoprolol tartrate (LOPRESSOR) 50 MG tablet Take 0.5 tablets (25 mg total) by mouth 2 (two) times daily. 03/27/18  Yes Josue Hector, MD  omeprazole (PRILOSEC) 20 MG capsule Take 20 mg by mouth daily. 12/23/15  Yes [provider]  ranitidine (ZANTAC) 300  MG tablet Take 300 mg by mouth every evening. 12/31/16  Yes [provider]  spironolactone-hydrochlorothiazide (ALDACTAZIDE) 25-25 MG tablet TAKE 1/2 TABLET BY MOUTH EVERY DAY Patient taking differently: Take 0.5 tablets by mouth daily.  03/02/18  Yes Josue Hector, MD    Inpatient Medications:     Allergies:  Allergies  Allergen Reactions  . Codeine Nausea And Vomiting  . Nabumetone     "FELT LIKE I WAS GOING TO PASS OUT"  . Oxycodone Nausea And Vomiting  . Statins Other (See Comments)    Cramping with Crestor, chest pain with Lipitor  . Zolpidem Tartrate Other (See Comments)    Goes wild  . Sulfamethoxazole-Trimethoprim Nausea  Only    Social History   Socioeconomic History  . Marital status: Married    Spouse name: Not on file  . Number of children: 4  . Years of education: Not on file  . Highest education level: Not on file  Occupational History  . Occupation: truck Animator Needs  . Financial resource strain: Not on file  . Food insecurity:    Worry: Not on file    Inability: Not on file  . Transportation needs:    Medical: Not on file    Non-medical: Not on file  Tobacco Use  . Smoking status: Former Smoker    Last attempt to quit: 11/26/1987    Years since quitting: 31.0  . Smokeless tobacco: Never Used  Substance and Sexual Activity  . Alcohol use: Yes    Comment: occasionally  . Drug use: No  . Sexual activity: Not on file  Lifestyle  . Physical activity:    Days per week: Not on file    Minutes per session: Not on file  . Stress: Not on file  Relationships  . Social connections:    Talks on phone: Not on file    Gets together: Not on file    Attends religious service: Not on file    Active member of club or organization: Not on file    Attends meetings of clubs or organizations: Not on file    Relationship status: Not on file  . Intimate partner violence:    Fear of current or ex partner: Not on file    Emotionally abused: Not on file    Physically abused: Not on file    Forced sexual activity: Not on file  Other Topics Concern  . Not on file  Social History Narrative  . Not on file     Family History  Problem Relation Age of Onset  . Diabetes Father   . Coronary artery disease Father   . Hypertension Father   . Cancer Sister   . Arthritis Mother      Review of Systems: Cardiovascular: negative for chest pain, edema, orthopnea, palpitations, paroxysmal nocturnal dyspnea, shortness of breath or dyspnea on exertion Respiratory: negative for cough or wheezing Abdominal: negative for nausea, vomiting, diarrhea, bright red blood per rectum, melena, or  hematemesis Neurologic: negative for visual changes, syncope, or dizziness All other systems reviewed and are otherwise negative except as noted above.  Labs: Recent Labs    12/22/18 1906  TROPONINI <0.03   Lab Results  Component Value Date   WBC 9.4 12/22/2018   HGB 13.6 12/22/2018   HCT 40.8 12/22/2018   MCV 98.8 12/22/2018   PLT 172 12/22/2018    Recent Labs  Lab 12/22/18 1137  NA 138  K 3.7  CL 103  CO2 25  BUN 11  CREATININE 0.80  CALCIUM 9.2  GLUCOSE 131*   Lab Results  Component Value Date   CHOL 220 (H) 02/14/2017   HDL 38 (L) 02/14/2017   LDLCALC 157 (H) 02/14/2017   TRIG 125 02/14/2017   No results found for: DDIMER  Radiology/Studies:  Dg Chest 2 View  Result Date: 12/22/2018 CLINICAL DATA:  Shortness of breath for 2 days EXAM: CHEST - 2 VIEW COMPARISON:  09/22/2017 FINDINGS: Cardiac shadow is stable. Postsurgical changes are again noted. The lungs are well aerated bilaterally. Mild chronic interstitial changes are seen without focal infiltrate or effusion. No acute bony abnormality is noted. IMPRESSION: No active cardiopulmonary disease. Electronically Signed   By: Inez Catalina M.D.   On: 12/22/2018 12:01   Ct Angio Chest Pe W And/or Wo Contrast  Result Date: 12/22/2018 CLINICAL DATA:  73 year old male with acute shortness of breath. History of CABG. EXAM: CT ANGIOGRAPHY CHEST WITH CONTRAST TECHNIQUE: Multidetector CT imaging of the chest was performed using the standard protocol during bolus administration of intravenous contrast. Multiplanar CT image reconstructions and MIPs were obtained to evaluate the vascular anatomy. CONTRAST:  75 cc intravenous Isovue 370 COMPARISON:  12/22/2018 and prior chest radiographs. 02/22/2017 and prior chest CTs FINDINGS: Cardiovascular: This is a technically satisfactory study but mild respiratory motion artifact slightly decreases sensitivity in the LOWER lungs. No pulmonary emboli are identified. Cardiomegaly, coronary  artery calcifications and CABG changes identified. Thoracic aortic atherosclerotic calcifications noted without aneurysm. No pericardial effusion. Mediastinum/Nodes: No enlarged mediastinal, hilar, or axillary lymph nodes. Thyroid gland, trachea, and esophagus demonstrate no significant findings. Lungs/Pleura: No airspace disease, consolidation, mass, suspicious nodule, pleural effusion or pneumothorax. Mild paraseptal emphysema and hypoventilatory changes in the patent lungs again noted. Upper Abdomen: No acute abnormality. Hepatic cysts are again identified. Musculoskeletal: No acute or suspicious bony abnormalities. Review of the MIP images confirms the above findings. IMPRESSION: 1. No evidence of acute abnormality. No evidence of pulmonary emboli. 2. Cardiomegaly, coronary artery disease and CABG changes. 3.  Aortic Atherosclerosis (ICD10-I70.0). Electronically Signed   By: Margarette Canada M.D.   On: 12/22/2018 20:45    Wt Readings from Last 3 Encounters:  02/25/18 101.4 kg  02/14/17 103 kg  02/12/16 99.3 kg    EKG: NSR with 1st degree AVB  Physical Exam: Blood pressure 113/70, pulse 65, temperature 98 F (36.7 C), temperature source Oral, resp. rate 15, SpO2 91 %. There is no height or weight on file to calculate BMI. General: Well developed, well nourished, in no acute distress. Head: Normocephalic, atraumatic, sclera non-icteric, no xanthomas, nares are without discharge.  Neck: Negative for carotid bruits. JVD not elevated. Lungs: Clear bilaterally to auscultation without wheezes, rales, or rhonchi. Breathing is unlabored. Heart: RRR with S1 S2. No murmurs, rubs, or gallops appreciated. Abdomen: Soft, non-tender, non-distended with normoactive bowel sounds. No hepatomegaly. No rebound/guarding. No obvious abdominal masses. Msk:  Strength and tone appear normal for age. Extremities: No clubbing or cyanosis. No edema.  Distal pedal pulses are 2+ and equal bilaterally. Neuro: Alert and  oriented X 3. No facial asymmetry. No focal deficit. Moves all extremities spontaneously. Psych:  Responds to questions appropriately with a normal affect.     Assessment and Plan   [name] is a 73 y.o. male with a history of CAD (s/p CABG x1), HL, HTN, OSA, prostate cancer who presents to the emergency room with arm tingling and chronic SOB.  #Arm tingling/non-cardiac pain: Patient presents with  1 day of arm tingling from his elbows down to his fingertips after performing a manual procedure where he kept his arms elevated for long period.  He has no chest pain, shortness of breath, shoulder pain, jaw pain.  His EKG and cardiac enzymes are normal.  This pain is different in character than his prior cardiac pain.  It is very unlikely that his arm tingling relates to manifestation of cardiac disease. -Follow-up with primary care regarding potentially neuropathic pain in the arms  #Shortness of breath: Patient also reports that he has had chronic shortness of breath with exertion specifically when going up hills.  States that this is been present for the past year and a half.  He was last evaluated by Dr. Johnsie Cancel on 02/25/2018 at which point he stated that he was able to walk his [place] terrier's regularly.  He had an echo in 2011 that showed normal ejection fraction and a normal Myoview in 2015.  He demonstrates no signs of volume overload on clinical exam today.  His chest x-ray and CT PE demonstrate no pulmonary edema nor pulmonary embolus or aortic aneurysm.  It is unlikely that his shortness of breath is related to congestive heart failure.  Recommend that he follow-up with Dr. Johnsie Cancel as an outaptient. -f/u with Dr. Johnsie Cancel as an outpatient  This plan was discussed with Dr. Dayna Barker in the emergency department as well as the patient and his wife.  Should the patient develop worsening symptoms, chest pain, severe shortness of breath recommend that he return to care for further  evaluation.  Signed, Lujean Amel, MD 12/22/2018, 11:57 PM

## 2018-12-22 NOTE — ED Notes (Signed)
Per Percell Miller, PA error in charting vitals, vitals removed d/t pt not being present in room 38

## 2018-12-22 NOTE — ED Triage Notes (Signed)
Pt endorses shob with bilateral arm tingling that began yesterday. Has hx of cabg x 1. VSS.

## 2018-12-23 NOTE — ED Provider Notes (Signed)
Emergency Department Provider Note   I have reviewed the triage vital signs and the nursing notes.   HISTORY  Chief Complaint Shortness of Breath   HPI [name] is a 73 y.o. male who is a poor historian.  Patient states that he has approximately a year and a half of shortness of breath and initially stated that over the last 3 days it had progressively worsen with increased weakness and dyspnea when walking around the house.  No chest pain, nausea, vomiting, diaphoresis but did have a little lightheadedness.  No other associated symptoms.  Patient has a history of coronary artery disease but also has a history of mild coughing with this.  I did see one else for his symptoms but is concerned he might have a heart blockage so presents here for further evaluation. No other associated or modifying symptoms.    Past Medical History:  Diagnosis Date  . Acid reflux   . CAD (coronary artery disease)    a. s/p CABG x 1 (LIMA->LAD);  b. 04/2013 Ex MV: Ex time 68mins, no isch/infarct, EF 60%.  Marland Kitchen HTN (hypertension)   . Hyperlipidemia    intolerant of all statins due to muscle aches  . OSA (obstructive sleep apnea)    resolved with weight loss  . Prostate cancer [facility])    s/p resection  . S/P colonoscopy with polypectomy     Patient Active Problem List   Diagnosis Date Noted  . Chest pain   . S/P CABG x 1   . Accelerated hypertension   . Unstable angina ([place]) 04/27/2013  . Coronary atherosclerosis of native coronary artery 07/26/2011  . Elevated lipids 07/03/2009  . HYPERTENSION, BENIGN 07/03/2009  . CAD, ARTERY BYPASS GRAFT 07/03/2009  . BRADYCARDIA 07/03/2009    Past Surgical History:  Procedure Laterality Date  . CORONARY ARTERY BYPASS GRAFT  2002  . CORONARY STENT PLACEMENT    . TOTAL HIP ARTHROPLASTY Bilateral     Current Outpatient Rx  . Order #: [id number] Class: Historical Med  . Order #: [id number] Class: Historical Med  . Order #: [id number] Class: Normal  .  Order #: [id number] Class: Historical Med  . Order #: [id number] Class: Historical Med  . Order #: [id number] Class: Normal  . Order #: [id number] Class: Historical Med  . Order #: [id number] Class: Historical Med  . Order #: [id number] Class: Normal    Allergies Codeine; Nabumetone; Oxycodone; Statins; Zolpidem tartrate; and Sulfamethoxazole-trimethoprim  Family History  Problem Relation Age of Onset  . Diabetes Father   . Coronary artery disease Father   . Hypertension Father   . Cancer Sister   . Arthritis Mother     Social History Social History   Tobacco Use  . Smoking status: Former Smoker    Last attempt to quit: 11/26/1987    Years since quitting: 31.0  . Smokeless tobacco: Never Used  Substance Use Topics  . Alcohol use: Yes    Comment: occasionally  . Drug use: No    Review of Systems  All other systems negative except as documented in the HPI. All pertinent positives and negatives as reviewed in the HPI. ____________________________________________   PHYSICAL EXAM:  VITAL SIGNS: ED Triage Vitals  Enc Vitals Group     BP 12/22/18 1126 (!) 141/75     Pulse Rate 12/22/18 1126 66     Resp 12/22/18 1126 18     Temp 12/22/18 1126 98 F (36.7 C)     Temp Source 12/22/18 1126 Oral  SpO2 12/22/18 1126 96 %    Constitutional: Alert and oriented. Well appearing and in no acute distress. Eyes: Conjunctivae are normal. PERRL. EOMI. Head: Atraumatic. Nose: No congestion/rhinnorhea. Mouth/Throat: Mucous membranes are moist.  Oropharynx non-erythematous. Neck: No stridor.  No meningeal signs.   Cardiovascular: Normal rate, regular rhythm. Good peripheral circulation. Grossly normal heart sounds.   Respiratory: Normal respiratory effort.  No retractions. Lungs CTAB. Gastrointestinal: Soft and nontender. No distention.  Musculoskeletal: No lower extremity tenderness nor edema. No gross deformities of extremities. Neurologic:  Normal speech and language. No gross focal  neurologic deficits are appreciated.  Skin:  Skin is warm, dry and intact. No rash noted.   ____________________________________________   LABS (all labs ordered are listed, but only abnormal results are displayed)  Labs Reviewed  BASIC METABOLIC PANEL - Abnormal; Notable for the following components:      Result Value   Glucose, Bld 131 (*)    All other components within normal limits  CBC - Abnormal; Notable for the following components:   RBC 4.13 (*)    All other components within normal limits  TROPONIN I  I-STAT TROPONIN, ED  I-STAT TROPONIN, ED   ____________________________________________  EKG   EKG Interpretation  Date/Time:  Tuesday December 22 2018 11:22:22 EST Ventricular Rate:  63 PR Interval:  214 QRS Duration: 90 QT Interval:  424 QTC Calculation: 433 R Axis:   -9 Text Interpretation:  Sinus rhythm with 1st degree A-V block Minimal voltage criteria for LVH, may be normal variant Cannot rule out Anterior infarct , age undetermined Abnormal ECG No acute changes Confirmed by Merrily Pew [phone]) on 12/22/2018 6:37:53 PM       ____________________________________________  RADIOLOGY  Dg Chest 2 View  Result Date: 12/22/2018 CLINICAL DATA:  Shortness of breath for 2 days EXAM: CHEST - 2 VIEW COMPARISON:  09/22/2017 FINDINGS: Cardiac shadow is stable. Postsurgical changes are again noted. The lungs are well aerated bilaterally. Mild chronic interstitial changes are seen without focal infiltrate or effusion. No acute bony abnormality is noted. IMPRESSION: No active cardiopulmonary disease. Electronically Signed   By: Inez Catalina M.D.   On: 12/22/2018 12:01   Ct Angio Chest Pe W And/or Wo Contrast  Result Date: 12/22/2018 CLINICAL DATA:  73 year old male with acute shortness of breath. History of CABG. EXAM: CT ANGIOGRAPHY CHEST WITH CONTRAST TECHNIQUE: Multidetector CT imaging of the chest was performed using the standard protocol during bolus administration  of intravenous contrast. Multiplanar CT image reconstructions and MIPs were obtained to evaluate the vascular anatomy. CONTRAST:  75 cc intravenous Isovue 370 COMPARISON:  12/22/2018 and prior chest radiographs. 02/22/2017 and prior chest CTs FINDINGS: Cardiovascular: This is a technically satisfactory study but mild respiratory motion artifact slightly decreases sensitivity in the LOWER lungs. No pulmonary emboli are identified. Cardiomegaly, coronary artery calcifications and CABG changes identified. Thoracic aortic atherosclerotic calcifications noted without aneurysm. No pericardial effusion. Mediastinum/Nodes: No enlarged mediastinal, hilar, or axillary lymph nodes. Thyroid gland, trachea, and esophagus demonstrate no significant findings. Lungs/Pleura: No airspace disease, consolidation, mass, suspicious nodule, pleural effusion or pneumothorax. Mild paraseptal emphysema and hypoventilatory changes in the patent lungs again noted. Upper Abdomen: No acute abnormality. Hepatic cysts are again identified. Musculoskeletal: No acute or suspicious bony abnormalities. Review of the MIP images confirms the above findings. IMPRESSION: 1. No evidence of acute abnormality. No evidence of pulmonary emboli. 2. Cardiomegaly, coronary artery disease and CABG changes. 3.  Aortic Atherosclerosis (ICD10-I70.0). Electronically Signed   By: Margarette Canada  M.D.   On: 12/22/2018 20:45    ____________________________________________   PROCEDURES  Procedure(s) performed:   Procedures   ____________________________________________   INITIAL IMPRESSION / ASSESSMENT AND PLAN / ED COURSE  Initially thought the patient had acute worsening of shortness of breath so PE scan done which was negative delta troponins were negative and cardiology consulted without abnormalities.  Then patient states that really is shortness of breath is not that much worse and is more related to the tingling in his arms that made him anxious that  he was having a heart attack and that is probably what made him short of breath.  He did tingling in his arms likely from raising his arms above his head helping him move something earlier in the week and can follow-up as an outpatient with his primary doctor for that.  Could also be a cervical issue but without any numbness or weakness and full strength on exam no indication for emergent imaging     Pertinent labs & imaging results that were available during my care of the patient were reviewed by me and considered in my medical decision making (see chart for details).  ____________________________________________  FINAL CLINICAL IMPRESSION(S) / ED DIAGNOSES  Final diagnoses:  Shortness of breath  Numbness and tingling of right arm  Numbness and tingling in left arm     MEDICATIONS GIVEN DURING THIS VISIT:  Medications  sodium chloride flush (NS) 0.9 % injection 3 mL (3 mLs Intravenous Given 12/22/18 1958)  iopamidol (ISOVUE-370) 76 % injection (  Contrast Given 12/22/18 2008)     NEW OUTPATIENT MEDICATIONS STARTED DURING THIS VISIT:  New Prescriptions   No medications on file    Note:  This note was prepared with assistance of Dragon voice recognition software. Occasional wrong-word or sound-a-like substitutions may have occurred due to the inherent limitations of voice recognition software.   Amiaya Mcneeley, Corene Cornea, MD 12/23/18 [phone]

## 2018-12-28 DIAGNOSIS — H5203 Hypermetropia, bilateral: Secondary | ICD-10-CM | POA: Diagnosis not present

## 2018-12-28 DIAGNOSIS — H2513 Age-related nuclear cataract, bilateral: Secondary | ICD-10-CM | POA: Diagnosis not present

## 2018-12-29 DIAGNOSIS — H8113 Benign paroxysmal vertigo, bilateral: Secondary | ICD-10-CM | POA: Diagnosis not present

## 2018-12-29 DIAGNOSIS — H6122 Impacted cerumen, left ear: Secondary | ICD-10-CM | POA: Diagnosis not present

## 2019-02-19 ENCOUNTER — Telehealth: Payer: Self-pay

## 2019-02-19 NOTE — Telephone Encounter (Signed)
Called patient to change his office visit to a virtual visit. Patient stated he was doing fine and does not need to be seen at this time. Patient stated he would like to be seen later in the Summer. Informed patient that his appointment can be rescheduled to August, and if he has any cardiac issues that come up to please give our office a call. Patient verbalized understanding.

## 2019-03-01 ENCOUNTER — Ambulatory Visit: Payer: Medicare Other | Admitting: Cardiovascular Disease

## 2019-03-11 ENCOUNTER — Other Ambulatory Visit: Payer: Self-pay | Admitting: Cardiovascular Disease

## 2019-04-26 ENCOUNTER — Other Ambulatory Visit: Payer: Self-pay | Admitting: Cardiovascular Disease

## 2019-05-11 DIAGNOSIS — Z08 Encounter for follow-up examination after completed treatment for malignant neoplasm: Secondary | ICD-10-CM | POA: Diagnosis not present

## 2019-05-11 DIAGNOSIS — Z8546 Personal history of malignant neoplasm of prostate: Secondary | ICD-10-CM | POA: Diagnosis not present

## 2019-05-11 DIAGNOSIS — C61 Malignant neoplasm of prostate: Secondary | ICD-10-CM | POA: Diagnosis not present

## 2019-05-16 ENCOUNTER — Other Ambulatory Visit: Payer: Self-pay | Admitting: Cardiovascular Disease

## 2019-05-18 ENCOUNTER — Other Ambulatory Visit: Payer: Self-pay | Admitting: Cardiovascular Disease

## 2019-05-18 MED ORDER — METOPROLOL TARTRATE 50 MG PO TABS: 25.0000 mg | ORAL_TABLET | Freq: Two times a day (BID) | ORAL | 0 refills | Status: DC

## 2019-05-18 NOTE — Telephone Encounter (Signed)
Pt's medication was sent to pt's pharmacy as requested. Confirmation received.  °

## 2019-06-09 DIAGNOSIS — Z6830 Body mass index (BMI) 30.0-30.9, adult: Secondary | ICD-10-CM | POA: Diagnosis not present

## 2019-06-09 DIAGNOSIS — E669 Obesity, unspecified: Secondary | ICD-10-CM | POA: Diagnosis not present

## 2019-06-09 DIAGNOSIS — Z136 Encounter for screening for cardiovascular disorders: Secondary | ICD-10-CM | POA: Diagnosis not present

## 2019-06-09 DIAGNOSIS — Z9181 History of falling: Secondary | ICD-10-CM | POA: Diagnosis not present

## 2019-06-09 DIAGNOSIS — Z139 Encounter for screening, unspecified: Secondary | ICD-10-CM | POA: Diagnosis not present

## 2019-06-09 DIAGNOSIS — Z1331 Encounter for screening for depression: Secondary | ICD-10-CM | POA: Diagnosis not present

## 2019-06-09 DIAGNOSIS — Z125 Encounter for screening for malignant neoplasm of prostate: Secondary | ICD-10-CM | POA: Diagnosis not present

## 2019-06-09 DIAGNOSIS — E785 Hyperlipidemia, unspecified: Secondary | ICD-10-CM | POA: Diagnosis not present

## 2019-06-09 DIAGNOSIS — Z0000 Encounter for general adult medical examination without abnormal findings: Secondary | ICD-10-CM | POA: Diagnosis not present

## 2019-07-01 DIAGNOSIS — R109 Unspecified abdominal pain: Secondary | ICD-10-CM | POA: Diagnosis not present

## 2019-07-02 DIAGNOSIS — K5730 Diverticulosis of large intestine without perforation or abscess without bleeding: Secondary | ICD-10-CM | POA: Diagnosis not present

## 2019-07-02 DIAGNOSIS — R109 Unspecified abdominal pain: Secondary | ICD-10-CM | POA: Diagnosis not present

## 2019-07-02 DIAGNOSIS — M479 Spondylosis, unspecified: Secondary | ICD-10-CM | POA: Diagnosis not present

## 2019-07-02 DIAGNOSIS — K760 Fatty (change of) liver, not elsewhere classified: Secondary | ICD-10-CM | POA: Diagnosis not present

## 2019-07-02 DIAGNOSIS — M47817 Spondylosis without myelopathy or radiculopathy, lumbosacral region: Secondary | ICD-10-CM | POA: Diagnosis not present

## 2019-07-07 ENCOUNTER — Telehealth: Payer: Self-pay | Admitting: Nurse Practitioner

## 2019-07-07 NOTE — Telephone Encounter (Signed)
YOUR CARDIOLOGY TEAM HAS ARRANGED FOR AN E-VISIT FOR YOUR APPOINTMENT - PLEASE REVIEW IMPORTANT INFORMATION BELOW SEVERAL DAYS PRIOR TO YOUR APPOINTMENT  Due to the recent COVID-19 pandemic, we are transitioning in-person office visits to tele-medicine visits in an effort to decrease unnecessary exposure to our patients, their families, and staff. These visits are billed to your insurance just like a normal visit is. We also encourage you to sign up for MyChart if you have not already done so. You will need a smartphone if possible. For patients that do not have this, we can still complete the visit using a regular telephone but do prefer a smartphone to enable video when possible. You may have a family member that lives with you that can help. If possible, we also ask that you have a blood pressure cuff and scale at home to measure your blood pressure, heart rate and weight prior to your scheduled appointment. Patients with clinical needs that need an in-person evaluation and testing will still be able to come to the office if absolutely necessary. If you have any questions, feel free to call our office.   YOUR PROVIDER WILL BE USING THE FOLLOWING PLATFORM TO COMPLETE YOUR VISIT: Doxy.Me  . IIF USING DOXIMITY or DOXY.ME - The staff will give you instructions on receiving your link to join the meeting the day of your visit.    2-3 DAYS BEFORE YOUR APPOINTMENT  You will receive a telephone call from one of our [place] team members - your caller ID may say "Unknown caller." If this is a video visit, we will walk you through how to get the video launched on your phone. We will remind you check your blood pressure, heart rate and weight prior to your scheduled appointment. If you have an Apple Watch or Kardia, please upload any pertinent ECG strips the day before or morning of your appointment to [facility]. Our staff will also make sure you have reviewed the consent and agree to move forward with your  scheduled tele-health visit.   THE DAY OF YOUR APPOINTMENT  Approximately 15 minutes prior to your scheduled appointment, you will receive a telephone call from one of [facility] team - your caller ID may say "Unknown caller."  Our staff will confirm medications, vital signs for the day and any symptoms you may be experiencing. Please have this information available prior to the time of visit start. It may also be helpful for you to have a pad of paper and pen handy for any instructions given during your visit. They will also walk you through joining the smartphone meeting if this is a video visit.   CONSENT FOR TELE-HEALTH VISIT - PLEASE REVIEW  I hereby voluntarily request, consent and authorize [facility] and its employed or contracted physicians, physician assistants, nurse practitioners or other licensed health care professionals (the Practitioner), to provide me with telemedicine health care services (the "Services") as deemed necessary by the treating Practitioner. I acknowledge and consent to receive the Services by the Practitioner via telemedicine. I understand that the telemedicine visit will involve communicating with the Practitioner through live audiovisual communication technology and the disclosure of certain medical information by electronic transmission. I acknowledge that I have been given the opportunity to request an in-person assessment or other available alternative prior to the telemedicine visit and am voluntarily participating in the telemedicine visit.  I understand that I have the right to withhold or withdraw my consent to the use of telemedicine in the course of  my care at any time, without affecting my right to future care or treatment, and that the Practitioner or I may terminate the telemedicine visit at any time. I understand that I have the right to inspect all information obtained and/or recorded in the course of the telemedicine visit and may receive copies of  available information for a reasonable fee.  I understand that some of the potential risks of receiving the Services via telemedicine include:  Marland Kitchen Delay or interruption in medical evaluation due to technological equipment failure or disruption; . Information transmitted may not be sufficient (e.g. poor resolution of images) to allow for appropriate medical decision making by the Practitioner; and/or  . In rare instances, security protocols could fail, causing a breach of personal health information.  Furthermore, I acknowledge that it is my responsibility to provide information about my medical history, conditions and care that is complete and accurate to the best of my ability. I acknowledge that Practitioner's advice, recommendations, and/or decision may be based on factors not within their control, such as incomplete or inaccurate data provided by me or distortions of diagnostic images or specimens that may result from electronic transmissions. I understand that the practice of medicine is not an exact science and that Practitioner makes no warranties or guarantees regarding treatment outcomes. I acknowledge that I will receive a copy of this consent concurrently upon execution via email to the email address I last provided but may also request a printed copy by calling the office of [facility].    I understand that my insurance will be billed for this visit.   I have read or had this consent read to me. . I understand the contents of this consent, which adequately explains the benefits and risks of the Services being provided via telemedicine.  . I have been provided ample opportunity to ask questions regarding this consent and the Services and have had my questions answered to my satisfaction. . I give my informed consent for the services to be provided through the use of telemedicine in my medical care  By participating in this telemedicine visit I agree to the above.

## 2019-07-13 NOTE — Progress Notes (Signed)
Virtual Visit via Video Note   This visit type was conducted due to national recommendations for restrictions regarding the COVID-19 Pandemic (e.g. social distancing) in an effort to limit this patient's exposure and mitigate transmission in our community.  Due to his co-morbid illnesses, this patient is at least at moderate risk for complications without adequate follow up.  This format is felt to be most appropriate for this patient at this time.  All issues noted in this document were discussed and addressed.  A limited physical exam was performed with this format.  Please refer to the patient's chart for his consent to telehealth for [name].   Date:  07/14/2019   ID:  [name], DOB 04/08/1946, MRN [id number]  Patient Location: Home Provider Location: Office  PCP:  Alexander Dress, MD  Cardiologist:   [name] Electrophysiologist:  None   Evaluation Performed:  Follow-Up Visit  Chief Complaint:  CAD  History of Present Illness:    73 y.o. 2002 LIMA to LAD for failed PCI.  HLD intolerant to statins Zetia and PSK9 cost prohibitive with mild chronic elevation in LFT;s fatty liver precludes enrollment in research trials. Last non ischemic myovue 2015. 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 Prostate CA history with surgery and XRT recurrence with PSA in 4 range on hormonal Rx  Needs repeat labs   The patient  does not have symptoms concerning for COVID-19 infection (fever, chills, cough, or new shortness of breath).    Past Medical History:  Diagnosis Date  . Acid reflux   . CAD (coronary artery disease)    a. s/p CABG x 1 (LIMA->LAD);  b. 04/2013 Ex MV: Ex time 89mins, no isch/infarct, EF 60%.  Marland Kitchen HTN (hypertension)   . Hyperlipidemia    intolerant of all statins due to muscle aches  . OSA (obstructive sleep apnea)    resolved with weight loss  . Prostate cancer [facility])    s/p resection  . S/P colonoscopy with  polypectomy    Past Surgical History:  Procedure Laterality Date  . CORONARY ARTERY BYPASS GRAFT  2002  . CORONARY STENT PLACEMENT    . TOTAL HIP ARTHROPLASTY Bilateral      Current Meds  Medication Sig  . aspirin 81 MG tablet Take 81 mg by mouth daily.   . Cholecalciferol (VITAMIN D-3) 125 MCG (5000 UT) TABS Take 1 capsule by mouth daily.   . enalapril (VASOTEC) 20 MG tablet Take 0.5-1 tablets (10-20 mg total) by mouth See admin instructions. Take 10mg  by mouth every day in the morning and 20mg  by mouth in the evening  . fexofenadine (ALLEGRA) 180 MG tablet Take 180 mg by mouth daily as needed for allergies or rhinitis.  . fish oil-omega-3 fatty acids 1000 MG capsule Take 2 g by mouth 2 (two) times daily.   . metoprolol tartrate (LOPRESSOR) 50 MG tablet Take 0.5 tablets (25 mg total) by mouth 2 (two) times daily. Please keep upcoming appt in August for future refills. Thank you  . montelukast (SINGULAIR) 10 MG tablet Take 10 mg by mouth daily.  Marland Kitchen omeprazole (PRILOSEC) 20 MG capsule Take 20 mg by mouth daily.  Marland Kitchen spironolactone-hydrochlorothiazide (ALDACTAZIDE) 25-25 MG tablet TAKE 1/2 TABLET BY MOUTH EVERY DAY  . traZODone (DESYREL) 150 MG tablet Take 150 mg by mouth daily.     Allergies:   Codeine, Nabumetone, Oxycodone, Statins, Zolpidem tartrate, and Sulfamethoxazole-trimethoprim   Social History   Tobacco Use  .  Smoking status: Former Smoker    Quit date: 11/26/1987    Years since quitting: 31.6  . Smokeless tobacco: Never Used  Substance Use Topics  . Alcohol use: Yes    Comment: occasionally  . Drug use: No     Family Hx: The patient's family history includes Arthritis in his mother; Cancer in his sister; Coronary artery disease in his father; Diabetes in his father; Hypertension in his father.  ROS:   Please see the history of present illness.     All other systems reviewed and are negative.   Prior CV studies:   The following studies were reviewed today:   Myovue 06/21/14  Labs/Other Tests and Data Reviewed:    EKG:  12/23/18 SR normal ECG slightly low voltage in precordium   Recent Labs: 12/22/2018: BUN 11; Creatinine, Ser 0.80; Hemoglobin 13.6; Platelets 172; Potassium 3.7; Sodium 138   Recent Lipid Panel Lab Results  Component Value Date/Time   CHOL 220 (H) 02/14/2017 09:16 AM   TRIG 125 02/14/2017 09:16 AM   HDL 38 (L) 02/14/2017 09:16 AM   CHOLHDL 5.8 (H) 02/14/2017 09:16 AM   CHOLHDL 6.6 (H) 02/12/2016 11:49 AM   LDLCALC 157 (H) 02/14/2017 09:16 AM   LDLDIRECT 144.9 07/23/2010 08:16 AM    Wt Readings from Last 3 Encounters:  07/14/19 224 lb (101.6 kg)  02/25/18 223 lb 8 oz (101.4 kg)  02/14/17 227 lb (103 kg)     Objective:    Vital Signs:  BP 128/75   Pulse 61   Ht 6' (1.829 m)   Wt 224 lb (101.6 kg)   BMI 30.38 kg/m    Skin warm and dry No distress No tachypnea No JVP elevation  Neuro appears non focal No edema  Telephone visit no exam   ASSESSMENT & PLAN:    1. CAD:  Active no angina LIMA to LAD 2002 continue medical Rx Myovue non ischemic 2015 2.  Prostate CA:  F/u urology post XRT  PSA serial checks [name] 3.  HLD:  LDL typically runs 160 range off meds will repeat labs and have him f/u in lipid clinic If LFTls normal may be able to enroll in trial  COVID-19 Education: The signs and symptoms of COVID-19 were discussed with the patient and how to seek care for testing (follow up with PCP or arrange E-visit).  The importance of social distancing was discussed today.  Time:   Today, I have spent 30 minutes with the patient with telehealth technology discussing the above problems.     Medication Adjustments/Labs and Tests Ordered: Current medicines are reviewed at length with the patient today.  Concerns regarding medicines are outlined above.   Tests Ordered:  Lipid and Liver  Medication Changes:  None   Disposition:  Follow up lipid clinic and me in 6 months  Signed, Alexander Rouge,  MD  07/14/2019 9:34 AM    [place]

## 2019-07-14 ENCOUNTER — Telehealth (INDEPENDENT_AMBULATORY_CARE_PROVIDER_SITE_OTHER): Payer: Medicare Other | Admitting: Cardiovascular Disease

## 2019-07-14 ENCOUNTER — Other Ambulatory Visit: Payer: Self-pay

## 2019-07-14 ENCOUNTER — Telehealth: Payer: Medicare Other | Admitting: Cardiovascular Disease

## 2019-07-14 ENCOUNTER — Encounter: Payer: Self-pay | Admitting: Cardiovascular Disease

## 2019-07-14 VITALS — BP 128/75 | HR 61 | Ht 72.0 in | Wt 224.0 lb

## 2019-07-14 DIAGNOSIS — Z951 Presence of aortocoronary bypass graft: Secondary | ICD-10-CM

## 2019-07-14 DIAGNOSIS — I25700 Atherosclerosis of coronary artery bypass graft(s), unspecified, with unstable angina pectoris: Secondary | ICD-10-CM

## 2019-07-14 DIAGNOSIS — I2510 Atherosclerotic heart disease of native coronary artery without angina pectoris: Secondary | ICD-10-CM | POA: Diagnosis not present

## 2019-07-14 DIAGNOSIS — E785 Hyperlipidemia, unspecified: Secondary | ICD-10-CM

## 2019-07-14 NOTE — Patient Instructions (Signed)
Your physician recommends that you continue on your current medications as directed. Please refer to the Current Medication list given to you today.   Your physician recommends that you return for lab work in:  [place] LIVER  F/U [place] physician wants you to follow-up in: [place] will receive a reminder letter in the mail two months in advance. If you don't receive a letter, please call our office to schedule the follow-up appointment.

## 2019-07-14 NOTE — Addendum Note (Signed)
Addended by: Devra Dopp E on: 07/14/2019 09:54 AM   Modules accepted: Orders

## 2019-07-16 ENCOUNTER — Other Ambulatory Visit: Payer: Medicare Other | Admitting: *Deleted

## 2019-07-16 ENCOUNTER — Other Ambulatory Visit: Payer: Self-pay

## 2019-07-16 DIAGNOSIS — Z951 Presence of aortocoronary bypass graft: Secondary | ICD-10-CM | POA: Diagnosis not present

## 2019-07-16 DIAGNOSIS — I25700 Atherosclerosis of coronary artery bypass graft(s), unspecified, with unstable angina pectoris: Secondary | ICD-10-CM | POA: Diagnosis not present

## 2019-07-16 DIAGNOSIS — I2510 Atherosclerotic heart disease of native coronary artery without angina pectoris: Secondary | ICD-10-CM | POA: Diagnosis not present

## 2019-07-16 DIAGNOSIS — E785 Hyperlipidemia, unspecified: Secondary | ICD-10-CM | POA: Diagnosis not present

## 2019-07-16 LAB — LIPID PANEL
Chol/HDL Ratio: 6 ratio — ABNORMAL HIGH (ref 0.0–5.0)
Cholesterol, Total: 204 mg/dL — ABNORMAL HIGH (ref 100–199)
HDL: 34 mg/dL — ABNORMAL LOW (ref >39)
LDL Calculated: 146 mg/dL — ABNORMAL HIGH (ref 0–99)
Triglycerides: 122 mg/dL (ref 0–149)
VLDL Cholesterol Cal: 24 mg/dL (ref 5–40)

## 2019-07-16 LAB — HEPATIC FUNCTION PANEL
ALT: 110 IU/L — ABNORMAL HIGH (ref 0–44)
AST: 126 IU/L — ABNORMAL HIGH (ref 0–40)
Albumin: 3.8 g/dL (ref 3.8–4.8)
Alkaline Phosphatase: 80 IU/L (ref 39–117)
Bilirubin Total: 0.6 mg/dL (ref 0.0–1.2)
Bilirubin, Direct: 0.17 mg/dL (ref 0.00–0.40)
Total Protein: 6.5 g/dL (ref 6.0–8.5)

## 2019-07-30 ENCOUNTER — Other Ambulatory Visit: Payer: Self-pay | Admitting: Cardiovascular Disease

## 2019-08-06 ENCOUNTER — Other Ambulatory Visit: Payer: Self-pay

## 2019-08-06 ENCOUNTER — Encounter: Payer: Self-pay | Admitting: Pharmacist

## 2019-08-06 ENCOUNTER — Ambulatory Visit (INDEPENDENT_AMBULATORY_CARE_PROVIDER_SITE_OTHER): Payer: Medicare Other | Admitting: Pharmacist

## 2019-08-06 DIAGNOSIS — I25700 Atherosclerosis of coronary artery bypass graft(s), unspecified, with unstable angina pectoris: Secondary | ICD-10-CM | POA: Diagnosis not present

## 2019-08-06 DIAGNOSIS — E785 Hyperlipidemia, unspecified: Secondary | ICD-10-CM

## 2019-08-06 NOTE — Patient Instructions (Signed)
We will submit a prior authorization for Repatha to your insurance company. Once we have approval we will call you with the cost. If the copay is too high, we can apply for a grant to help pick up the cost.  Call us at [phone] with any questions or concerns.

## 2019-08-06 NOTE — Progress Notes (Signed)
Patient ID: [name]                 DOB: [birth year]                    MRN: [id number]     HPI: [name] is a 73 y.o. male patient referred to lipid clinic by Dr. Johnsie Cancel. PMH is significant for CAD s/p CABG, prostate cancer, HLD and fatty liver. Patient has been intolerant to statins due to muscle pains. Patient also has fatty liver and elevated LFT which prohibits [place] from using statin mediations.  Patient presents today to clinic to discuss PCSK9 inhibitors.  Patient states that after radiation for his prostate cancer he just doesn't have the energy he once did. Does walk his dogs everyday. Gym at the [employer] is closed due to [place].   Current Medications: none Intolerances: rosuvastatin 10mg  daily, pravastatin 20mg  daily, zetia 10mg  daily (muscle pains) Risk Factors: CAD,HTN LDL goal: <70  Diet: ham and cheese sandwich, leftovers 50/50 from home or out, steak, hamburger, fried bolona, catfish nuggets, salads (ranch, thousand [place]).    Exercise: walks dog  Family History: The patient's family history includes Arthritis in his mother; Cancer in his sister; Coronary artery disease in his father; Diabetes in his father; Hypertension in his father.  Social History: former smoker, occasional ETOH  Labs: 07/16/2019 TC 204, TG 122, HDL 34, LDL 146  Past Medical History:  Diagnosis Date  . Acid reflux   . CAD (coronary artery disease)    a. s/p CABG x 1 (LIMA->LAD);  b. 04/2013 Ex MV: Ex time 37mins, no isch/infarct, EF 60%.  Marland Kitchen HTN (hypertension)   . Hyperlipidemia    intolerant of all statins due to muscle aches  . OSA (obstructive sleep apnea)    resolved with weight loss  . Prostate cancer [facility])    s/p resection  . S/P colonoscopy with polypectomy     Current Outpatient Medications on File Prior to Visit  Medication Sig Dispense Refill  . aspirin 81 MG tablet Take 81 mg by mouth daily.     . Cholecalciferol (VITAMIN D-3) 125 MCG (5000 UT) TABS Take 1 capsule by  mouth daily.     . enalapril (VASOTEC) 20 MG tablet TAKE 1/2 TABLET BY MOUTH IN THE MORNING AND 1 TABLET BY MOUTH EVERY EVENING 135 tablet 3  . fexofenadine (ALLEGRA) 180 MG tablet Take 180 mg by mouth daily as needed for allergies or rhinitis.    . fish oil-omega-3 fatty acids 1000 MG capsule Take 2 g by mouth 2 (two) times daily.     . metoprolol tartrate (LOPRESSOR) 50 MG tablet Take 0.5 tablets (25 mg total) by mouth 2 (two) times daily. Please keep upcoming appt in August for future refills. Thank you 90 tablet 0  . montelukast (SINGULAIR) 10 MG tablet Take 10 mg by mouth daily.    Marland Kitchen omeprazole (PRILOSEC) 20 MG capsule Take 20 mg by mouth daily.    Marland Kitchen spironolactone-hydrochlorothiazide (ALDACTAZIDE) 25-25 MG tablet TAKE 1/2 TABLET BY MOUTH EVERY DAY 45 tablet 2  . traZODone (DESYREL) 150 MG tablet Take 150 mg by mouth daily.     No current facility-administered medications on file prior to visit.     Allergies  Allergen Reactions  . Codeine Nausea And Vomiting  . Nabumetone     "FELT LIKE I WAS GOING TO PASS OUT"  . Oxycodone Nausea And Vomiting  . Statins Other (See Comments)  Cramping with Crestor, chest pain with Lipitor  . Zolpidem Tartrate Other (See Comments)    Goes wild  . Sulfamethoxazole-Trimethoprim Nausea Only    Assessment/Plan:  1. Hyperlipidemia - LDL is above goal of <70. Will submit PA for Repatha. Patient is in agreement. No adjustments for Repatha needed in mild-mod hepatic impairment and no elevated LFT reported with Repatha. Will submit PA and will contact patient once approved.Patient lives 1.5 hr away. Will try to do repeat labs in [place] [facility] since it is closer.   Thank you,  Ramond Dial, Pharm.D, [place]  [id number], [place], [place] [address]  Phone: [phone]; Fax: [phone]

## 2019-08-09 ENCOUNTER — Telehealth: Payer: Self-pay | Admitting: Pharmacist

## 2019-08-09 DIAGNOSIS — E785 Hyperlipidemia, unspecified: Secondary | ICD-10-CM

## 2019-08-09 MED ORDER — REPATHA SURECLICK 140 MG/ML ~~LOC~~ SOAJ: 1.0000 "pen " | SUBCUTANEOUS | 11 refills | Status: DC

## 2019-08-09 NOTE — Telephone Encounter (Signed)
Patient returned call. Will apply for [employer]. If grant does not cover entire year, should be able to provide a sample or two. Will call patient once determination is made.

## 2019-08-09 NOTE — Telephone Encounter (Signed)
PA for Repatha approved through 08/07/2019. Unfortunately cost is $445. It appears patient may still owe deductible. Also appears cost after that is 40%.  Call patient and left VM to return call. Will discuss with patient applying for a grant from [employer].

## 2019-08-10 NOTE — Telephone Encounter (Addendum)
Pt approved for [employer] for $2500 towards copay assistance through 07/09/20. Info provided to pharmacy, confirmed $0 copay. Called pt to make him aware, phone kept ringing, unable to leave message. Will try back later.

## 2019-08-10 NOTE — Telephone Encounter (Signed)
Left message for pt

## 2019-08-11 NOTE — Addendum Note (Signed)
Addended by: SUPPLE, MEGAN E on: 08/11/2019 09:59 AM   Modules accepted: Orders

## 2019-08-11 NOTE — Telephone Encounter (Addendum)
Called pt, he is aware to pick up Repatha at his pharmacy. States he prefers to have follow up labs drawn at [facility] in [place]. Orders have been entered, he will go the 3rd week of November since he doesn't plan to give his first Repatha shot until 9/28 when [place] the beach.

## 2019-08-15 ENCOUNTER — Other Ambulatory Visit: Payer: Self-pay | Admitting: Cardiovascular Disease

## 2019-09-29 ENCOUNTER — Other Ambulatory Visit: Payer: Self-pay | Admitting: Cardiovascular Disease

## 2019-10-12 DIAGNOSIS — E785 Hyperlipidemia, unspecified: Secondary | ICD-10-CM | POA: Diagnosis not present

## 2019-10-13 LAB — LIPID PANEL
Chol/HDL Ratio: 2.6 ratio (ref 0.0–5.0)
Cholesterol, Total: 113 mg/dL (ref 100–199)
HDL: 44 mg/dL (ref >39)
LDL Chol Calc (NIH): 49 mg/dL (ref 0–99)
Triglycerides: 105 mg/dL (ref 0–149)
VLDL Cholesterol Cal: 20 mg/dL (ref 5–40)

## 2019-10-13 LAB — HEPATIC FUNCTION PANEL
ALT: 84 IU/L — ABNORMAL HIGH (ref 0–44)
AST: 93 IU/L — ABNORMAL HIGH (ref 0–40)
Albumin: 4.1 g/dL (ref 3.7–4.7)
Alkaline Phosphatase: 110 IU/L (ref 39–117)
Bilirubin Total: 0.6 mg/dL (ref 0.0–1.2)
Bilirubin, Direct: 0.25 mg/dL (ref 0.00–0.40)
Total Protein: 7 g/dL (ref 6.0–8.5)
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
## 2020-01-12 ENCOUNTER — Ambulatory Visit (INDEPENDENT_AMBULATORY_CARE_PROVIDER_SITE_OTHER): Payer: Medicare Other | Admitting: Cardiovascular Disease

## 2020-01-12 ENCOUNTER — Encounter: Payer: Self-pay | Admitting: Cardiovascular Disease

## 2020-01-12 ENCOUNTER — Other Ambulatory Visit: Payer: Self-pay

## 2020-01-12 VITALS — BP 112/72 | HR 64 | Ht 72.0 in | Wt 234.0 lb

## 2020-01-12 DIAGNOSIS — E785 Hyperlipidemia, unspecified: Secondary | ICD-10-CM

## 2020-01-12 DIAGNOSIS — R002 Palpitations: Secondary | ICD-10-CM

## 2020-01-12 NOTE — Patient Instructions (Addendum)
Medication Instructions:   *If you need a refill on your cardiac medications before your next appointment, please call your pharmacy*  Lab Work: Your physician recommends that you return for lab work in: May for fasting lipid and liver panel and BMET  If you have labs (blood work) drawn today and your tests are completely normal, you will receive your results only by: Marland Kitchen MyChart Message (if you have MyChart) OR . A paper copy in the mail If you have any lab test that is abnormal or we need to change your treatment, we will call you to review the results.  Testing/Procedures: None ordered today.  Follow-Up: At [facility], you and your health needs are our priority.  As part of our continuing mission to provide you with exceptional heart care, we have created designated Provider Care Teams.  These Care Teams include your primary Cardiologist (physician) and Advanced Practice Providers (APPs -  Physician Assistants and Nurse Practitioners) who all work together to provide you with the care you need, when you need it.  Your next appointment:   6 month(s)  The format for your next appointment:   In Person  Provider:   You may see Dr. Johnsie Cancel or one of the following Advanced Practice Providers on your designated Care Team:    Truitt Merle, NP  Cecilie Kicks, NP  Kathyrn Drown, NP

## 2020-01-14 NOTE — Addendum Note (Signed)
Addended by: Aris Georgia, Shira Bobst L on: 01/14/2020 10:17 AM   Modules accepted: Orders

## 2020-01-26 DIAGNOSIS — M19131 Post-traumatic osteoarthritis, right wrist: Secondary | ICD-10-CM | POA: Diagnosis not present

## 2020-02-10 DIAGNOSIS — N529 Male erectile dysfunction, unspecified: Secondary | ICD-10-CM | POA: Diagnosis not present

## 2020-02-10 DIAGNOSIS — K5732 Diverticulitis of large intestine without perforation or abscess without bleeding: Secondary | ICD-10-CM | POA: Diagnosis not present

## 2020-03-30 DIAGNOSIS — N62 Hypertrophy of breast: Secondary | ICD-10-CM | POA: Diagnosis not present

## 2020-03-30 DIAGNOSIS — T50905A Adverse effect of unspecified drugs, medicaments and biological substances, initial encounter: Secondary | ICD-10-CM | POA: Diagnosis not present

## 2020-05-08 DIAGNOSIS — C61 Malignant neoplasm of prostate: Secondary | ICD-10-CM | POA: Diagnosis not present

## 2020-05-08 DIAGNOSIS — Z192 Hormone resistant malignancy status: Secondary | ICD-10-CM | POA: Diagnosis not present

## 2020-06-14 DIAGNOSIS — Z1331 Encounter for screening for depression: Secondary | ICD-10-CM | POA: Diagnosis not present

## 2020-06-14 DIAGNOSIS — Z9181 History of falling: Secondary | ICD-10-CM | POA: Diagnosis not present

## 2020-06-14 DIAGNOSIS — Z6831 Body mass index (BMI) 31.0-31.9, adult: Secondary | ICD-10-CM | POA: Diagnosis not present

## 2020-06-14 DIAGNOSIS — Z0000 Encounter for general adult medical examination without abnormal findings: Secondary | ICD-10-CM | POA: Diagnosis not present

## 2020-06-14 DIAGNOSIS — E785 Hyperlipidemia, unspecified: Secondary | ICD-10-CM | POA: Diagnosis not present

## 2020-06-14 DIAGNOSIS — Z139 Encounter for screening, unspecified: Secondary | ICD-10-CM | POA: Diagnosis not present

## 2020-06-27 DIAGNOSIS — M25561 Pain in right knee: Secondary | ICD-10-CM | POA: Diagnosis not present

## 2020-06-27 DIAGNOSIS — M1711 Unilateral primary osteoarthritis, right knee: Secondary | ICD-10-CM | POA: Diagnosis not present

## 2020-07-25 ENCOUNTER — Other Ambulatory Visit: Payer: Self-pay

## 2020-07-25 ENCOUNTER — Telehealth: Payer: Self-pay | Admitting: *Deleted

## 2020-07-25 ENCOUNTER — Encounter: Payer: Self-pay | Admitting: Cardiology

## 2020-07-25 ENCOUNTER — Telehealth (INDEPENDENT_AMBULATORY_CARE_PROVIDER_SITE_OTHER): Payer: Medicare Other | Admitting: Cardiology

## 2020-07-25 VITALS — BP 116/59 | HR 43 | Ht 72.0 in | Wt 234.0 lb

## 2020-07-25 DIAGNOSIS — I2510 Atherosclerotic heart disease of native coronary artery without angina pectoris: Secondary | ICD-10-CM

## 2020-07-25 DIAGNOSIS — Z951 Presence of aortocoronary bypass graft: Secondary | ICD-10-CM | POA: Diagnosis not present

## 2020-07-25 DIAGNOSIS — I10 Essential (primary) hypertension: Secondary | ICD-10-CM

## 2020-07-25 DIAGNOSIS — E785 Hyperlipidemia, unspecified: Secondary | ICD-10-CM | POA: Diagnosis not present

## 2020-07-25 NOTE — Progress Notes (Signed)
Virtual Visit via Telephone Note   This visit type was conducted due to national recommendations for restrictions regarding the COVID-19 Pandemic (e.g. social distancing) in an effort to limit this patient's exposure and mitigate transmission in our community.  Due to his co-morbid illnesses, this patient is at least at moderate risk for complications without adequate follow up.  This format is felt to be most appropriate for this patient at this time.  The patient did not have access to video technology/had technical difficulties with video requiring transitioning to audio format only (telephone).  All issues noted in this document were discussed and addressed.  No physical exam could be performed with this format.  Please refer to the patient's chart for his  consent to telehealth for [facility].    Date:  07/25/2020   ID:  [name], DOB 10-11-1946, MRN [id number] The patient was identified using 2 identifiers.  Patient Location: Home Provider Location: Home Office  PCP:  Nicoletta Dress, MD  Cardiologist:  [name], MD  Electrophysiologist:  None   Evaluation Performed:  Follow-Up Visit  Chief Complaint:  CAD  History of Present Illness:    ESAUL DORWART is a 74 y.o. male with hx CAD  Failed angioplasty to LAD with wire perforation requiring single vessel LIMA to LAD 2002. Normal EF by echo in 2011 Normal myovue in 2015. Biggest issue is HLD. Intolerant to statins with chronic mild elevation in LFTls ? Fatty liver.  Started on Repatha 08/09/19 could afford with grant but has run out and could cost $500.  He is interested in talking to our lipid clinic and would come in for this.     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  Had cortisone shot and plans on receiving gel.    Prostate cancer has recurred seeing oncology at  [facility] has had XRT and getting hormone shots PSA was only 4 had prostatectomy 10 years ago has follow up every 6 months.  Labs from 10/12/19 reviewed and LDL 49  AST/ALT mildly elevated chronically 93/84 if PCP does not draw follow up he will call us.   His HR on machine was 43 but pt asymptomatic he does not believe correct.   The patient does not have symptoms concerning for COVID-19 infection (fever, chills, cough, or new shortness of breath).    Past Medical History:  Diagnosis Date  . Acid reflux   . CAD (coronary artery disease)    a. s/p CABG x 1 (LIMA->LAD);  b. 04/2013 Ex MV: Ex time 36mins, no isch/infarct, EF 60%.  Marland Kitchen HTN (hypertension)   . Hyperlipidemia    intolerant of all statins due to muscle aches  . OSA (obstructive sleep apnea)    resolved with weight loss  . Prostate cancer [facility])    s/p resection  . S/P colonoscopy with polypectomy    Past Surgical History:  Procedure Laterality Date  . CORONARY ARTERY BYPASS GRAFT  2002  . CORONARY STENT PLACEMENT    . TOTAL HIP ARTHROPLASTY Bilateral      Current Meds  Medication Sig  . aspirin 81 MG tablet Take 81 mg by mouth daily.   . Cholecalciferol (VITAMIN D-3) 125 MCG (5000 UT) TABS Take 1 capsule by mouth daily.   . enalapril (VASOTEC) 20 MG tablet TAKE 1/2 TABLET BY MOUTH IN THE MORNING AND  1 TABLET BY MOUTH EVERY EVENING  . fexofenadine (ALLEGRA) 180 MG tablet Take 180 mg by mouth daily as needed for allergies or rhinitis.  . fish oil-omega-3 fatty acids 1000 MG capsule Take 2 g by mouth 2 (two) times daily.   . metoprolol tartrate (LOPRESSOR) 50 MG tablet Take 0.5 tablets (25 mg total) by mouth 2 (two) times daily.  . montelukast (SINGULAIR) 10 MG tablet Take 10 mg by mouth daily.  Marland Kitchen omeprazole (PRILOSEC) 20 MG capsule Take 20 mg by mouth daily.  Marland Kitchen spironolactone-hydrochlorothiazide (ALDACTAZIDE) 25-25 MG tablet TAKE 1/2 TABLET BY MOUTH EVERY DAY  . traZODone (DESYREL) 150 MG tablet Take 150 mg by mouth  daily.     Allergies:   Codeine, Nabumetone, Oxycodone, Statins, Zolpidem tartrate, and Sulfamethoxazole-trimethoprim   Social History   Tobacco Use  . Smoking status: Former Smoker    Quit date: 11/26/1987    Years since quitting: 32.6  . Smokeless tobacco: Never Used  Vaping Use  . Vaping Use: Never used  Substance Use Topics  . Alcohol use: Yes    Comment: occasionally  . Drug use: No     Family Hx: The patient's family history includes Arthritis in his mother; Cancer in his sister; Coronary artery disease in his father; Diabetes in his father; Hypertension in his father.  ROS:   Please see the history of present illness.    General:no colds or fevers, no weight changes Skin:no rashes or ulcers HEENT:no blurred vision, no congestion CV:see HPI PUL:see HPI  Wears his cpcp GI:no diarrhea constipation or melena, no indigestion GU:no hematuria, no dysuria MS:no joint pain, no claudication Neuro:no syncope, no lightheadedness Endo:no diabetes, no thyroid disease  All other systems reviewed and are negative.   Prior CV studies:   The following studies were reviewed today:  nuc study 2015 was normal  Labs/Other Tests and Data Reviewed:    EKG:  No ECG reviewed.  Recent Labs: 10/12/2019: ALT 84   Recent Lipid Panel Lab Results  Component Value Date/Time   CHOL 113 10/12/2019 09:47 AM   TRIG 105 10/12/2019 09:47 AM   HDL 44 10/12/2019 09:47 AM   CHOLHDL 2.6 10/12/2019 09:47 AM   CHOLHDL 6.6 (H) 02/12/2016 11:49 AM   LDLCALC 49 10/12/2019 09:47 AM   LDLDIRECT 144.9 07/23/2010 08:16 AM    Wt Readings from Last 3 Encounters:  07/25/20 234 lb (106.1 kg)  01/12/20 234 lb (106.1 kg)  07/14/19 224 lb (101.6 kg)     Objective:    Vital Signs:  BP (!) 116/59   Pulse (!) 43   Ht 6' (1.829 m)   Wt 234 lb (106.1 kg)   BMI 31.74 kg/m    VITAL SIGNS:  reviewed General NAD Neuro A&O X 3  Pulmonary can speak in full sentence without SOB  ASSESSMENT & PLAN:     1. CAD single vessel CABG with LIMA to LAD after wire perforation in 2002, no angina. Keep follow up in 6 months with Dr. Johnsie Cancel continue ASA, BB though if HR is low will need to decrease.  2. HLD was on Repatha and now cannot afford will send message to lipid clinic to see if they can assist, he is willing to come back to lipid clinic if needed.  LDL was at goal with this 3.  Htn controlled no change in meds,  4. Bradycardia may be machine malfunction he will recheck but asymptomatic  5. Prostate cancer followed every 6 months with urology.  COVID-19 Education: The signs and symptoms of COVID-19 were discussed with the patient and how to seek care for testing (follow up with PCP or arrange E-visit).  The importance of social distancing was discussed today.  Time:   Today, I have spent 10 minutes with the patient with telehealth technology discussing the above problems.     Medication Adjustments/Labs and Tests Ordered: Current medicines are reviewed at length with the patient today.  Concerns regarding medicines are outlined above.   Tests Ordered: No orders of the defined types were placed in this encounter.   Medication Changes: No orders of the defined types were placed in this encounter.   Follow Up:  In Person in 6 month(s)  Signed, Cecilie Kicks, NP  07/25/2020 6:04 PM    [place]

## 2020-07-25 NOTE — Telephone Encounter (Signed)
°  Patient Consent for Virtual Visit         ROC STREETT has provided verbal consent on 07/25/2020 for a virtual visit (video or telephone).   CONSENT FOR VIRTUAL VISIT FOR:  [name]  By participating in this virtual visit I agree to the following:  I hereby voluntarily request, consent and authorize [facility] and its employed or contracted physicians, physician assistants, nurse practitioners or other licensed health care professionals (the Practitioner), to provide me with telemedicine health care services (the Services") as deemed necessary by the treating Practitioner. I acknowledge and consent to receive the Services by the Practitioner via telemedicine. I understand that the telemedicine visit will involve communicating with the Practitioner through live audiovisual communication technology and the disclosure of certain medical information by electronic transmission. I acknowledge that I have been given the opportunity to request an in-person assessment or other available alternative prior to the telemedicine visit and am voluntarily participating in the telemedicine visit.  I understand that I have the right to withhold or withdraw my consent to the use of telemedicine in the course of my care at any time, without affecting my right to future care or treatment, and that the Practitioner or I may terminate the telemedicine visit at any time. I understand that I have the right to inspect all information obtained and/or recorded in the course of the telemedicine visit and may receive copies of available information for a reasonable fee.  I understand that some of the potential risks of receiving the Services via telemedicine include:   Delay or interruption in medical evaluation due to technological equipment failure or disruption;  Information transmitted may not be sufficient (e.g. poor resolution of images) to allow for appropriate medical decision making by the  Practitioner; and/or   In rare instances, security protocols could fail, causing a breach of personal health information.  Furthermore, I acknowledge that it is my responsibility to provide information about my medical history, conditions and care that is complete and accurate to the best of my ability. I acknowledge that Practitioner's advice, recommendations, and/or decision may be based on factors not within their control, such as incomplete or inaccurate data provided by me or distortions of diagnostic images or specimens that may result from electronic transmissions. I understand that the practice of medicine is not an exact science and that Practitioner makes no warranties or guarantees regarding treatment outcomes. I acknowledge that a copy of this consent can be made available to me via my patient portal ([facility]), or I can request a printed copy by calling the office of [facility].    I understand that my insurance will be billed for this visit.   I have read or had this consent read to me.  I understand the contents of this consent, which adequately explains the benefits and risks of the Services being provided via telemedicine.   I have been provided ample opportunity to ask questions regarding this consent and the Services and have had my questions answered to my satisfaction.  I give my informed consent for the services to be provided through the use of telemedicine in my medical care

## 2020-07-25 NOTE — Patient Instructions (Signed)
Medication Instructions:  Your physician recommends that you continue on your current medications as directed. Please refer to the Current Medication list given to you today.  *If you need a refill on your cardiac medications before your next appointment, please call your pharmacy*   Lab Work: None ordered If you have labs (blood work) drawn today and your tests are completely normal, you will receive your results only by:  [place] (if you have MyChart) OR  A paper copy in the mail If you have any lab test that is abnormal or we need to change your treatment, we will call you to review the results.   Testing/Procedures: None ordered   Follow-Up: At [facility], you and your health needs are our priority.  As part of our continuing mission to provide you with exceptional heart care, we have created designated Provider Care Teams.  These Care Teams include your primary Cardiologist (physician) and Advanced Practice Providers (APPs -  Physician Assistants and Nurse Practitioners) who all work together to provide you with the care you need, when you need it.  We recommend signing up for the patient portal called "MyChart".  Sign up information is provided on this After Visit Summary.  MyChart is used to connect with patients for Virtual Visits (Telemedicine).  Patients are able to view lab/test results, encounter notes, upcoming appointments, etc.  Non-urgent messages can be sent to your provider as well.   To learn more about what you can do with MyChart, go to [email or web address].    Your next appointment:   6 month(s)  The format for your next appointment:   In Person  Provider:   You may see [name], MD or one of the following Advanced Practice Providers on your designated Care Team:    Truitt Merle, NP  Cecilie Kicks, NP  Kathyrn Drown, NP    Other Instructions

## 2020-07-26 ENCOUNTER — Telehealth: Payer: Self-pay | Admitting: *Deleted

## 2020-07-26 ENCOUNTER — Telehealth: Payer: Self-pay | Admitting: Pharmacist

## 2020-07-26 NOTE — Telephone Encounter (Signed)
Placed call to pt.  He checked it after the appointment yesterday, several times, and it was 57 and 58.  Had pt check it while on the phone and it registered at 62.

## 2020-07-26 NOTE — Telephone Encounter (Signed)
Spoke with patient about his [employer] grant. Looks like it expired. Patient provided me with household income and I was able to renew grant. New info faxed over to his pharmacy. Patient appreciative of the help.

## 2020-07-26 NOTE — Telephone Encounter (Signed)
-----   Message from Isaiah Serge, NP sent at 07/25/2020  6:21 PM EDT ----- Please check with pt to check his pulse.  Thanks.

## 2020-07-27 DIAGNOSIS — J019 Acute sinusitis, unspecified: Secondary | ICD-10-CM | POA: Diagnosis not present

## 2020-07-27 DIAGNOSIS — B9689 Other specified bacterial agents as the cause of diseases classified elsewhere: Secondary | ICD-10-CM | POA: Diagnosis not present

## 2020-07-27 DIAGNOSIS — R6889 Other general symptoms and signs: Secondary | ICD-10-CM | POA: Diagnosis not present

## 2020-07-27 NOTE — Telephone Encounter (Signed)
Thank you so much

## 2020-07-27 NOTE — Telephone Encounter (Signed)
Great - thanks

## 2020-08-02 DIAGNOSIS — U071 COVID-19: Secondary | ICD-10-CM | POA: Diagnosis not present

## 2020-08-09 ENCOUNTER — Other Ambulatory Visit: Payer: Self-pay | Admitting: Cardiovascular Disease

## 2020-08-09 NOTE — Telephone Encounter (Signed)
Pt pharmacy requesting refill for REPATHA 119 MG/ML SURECLICK, please advise.

## 2020-08-16 ENCOUNTER — Other Ambulatory Visit: Payer: Self-pay | Admitting: Cardiovascular Disease

## 2020-08-23 DIAGNOSIS — M1711 Unilateral primary osteoarthritis, right knee: Secondary | ICD-10-CM | POA: Diagnosis not present

## 2020-09-06 DIAGNOSIS — K5730 Diverticulosis of large intestine without perforation or abscess without bleeding: Secondary | ICD-10-CM | POA: Diagnosis not present

## 2020-09-06 DIAGNOSIS — K648 Other hemorrhoids: Secondary | ICD-10-CM | POA: Diagnosis not present

## 2020-09-06 DIAGNOSIS — K219 Gastro-esophageal reflux disease without esophagitis: Secondary | ICD-10-CM | POA: Diagnosis not present

## 2020-09-26 DIAGNOSIS — K5730 Diverticulosis of large intestine without perforation or abscess without bleeding: Secondary | ICD-10-CM | POA: Diagnosis not present

## 2020-09-26 DIAGNOSIS — Z86010 Personal history of colonic polyps: Secondary | ICD-10-CM | POA: Diagnosis not present

## 2020-09-26 DIAGNOSIS — Z1211 Encounter for screening for malignant neoplasm of colon: Secondary | ICD-10-CM | POA: Diagnosis not present

## 2020-09-26 DIAGNOSIS — K5750 Diverticulosis of both small and large intestine without perforation or abscess without bleeding: Secondary | ICD-10-CM | POA: Diagnosis not present

## 2020-09-26 DIAGNOSIS — Z09 Encounter for follow-up examination after completed treatment for conditions other than malignant neoplasm: Secondary | ICD-10-CM | POA: Diagnosis not present

## 2020-10-23 DIAGNOSIS — E785 Hyperlipidemia, unspecified: Secondary | ICD-10-CM | POA: Diagnosis not present

## 2020-10-23 DIAGNOSIS — Z125 Encounter for screening for malignant neoplasm of prostate: Secondary | ICD-10-CM | POA: Diagnosis not present

## 2020-10-23 DIAGNOSIS — I25708 Atherosclerosis of coronary artery bypass graft(s), unspecified, with other forms of angina pectoris: Secondary | ICD-10-CM | POA: Diagnosis not present

## 2020-10-23 DIAGNOSIS — I10 Essential (primary) hypertension: Secondary | ICD-10-CM | POA: Diagnosis not present

## 2020-10-23 DIAGNOSIS — K5732 Diverticulitis of large intestine without perforation or abscess without bleeding: Secondary | ICD-10-CM | POA: Diagnosis not present

## 2020-10-31 DIAGNOSIS — H18413 Arcus senilis, bilateral: Secondary | ICD-10-CM | POA: Diagnosis not present

## 2020-10-31 DIAGNOSIS — H2513 Age-related nuclear cataract, bilateral: Secondary | ICD-10-CM | POA: Diagnosis not present

## 2020-10-31 DIAGNOSIS — H524 Presbyopia: Secondary | ICD-10-CM | POA: Diagnosis not present

## 2020-10-31 DIAGNOSIS — H43392 Other vitreous opacities, left eye: Secondary | ICD-10-CM | POA: Diagnosis not present

## 2020-11-10 DIAGNOSIS — C61 Malignant neoplasm of prostate: Secondary | ICD-10-CM | POA: Diagnosis not present

## 2020-11-15 DIAGNOSIS — K5792 Diverticulitis of intestine, part unspecified, without perforation or abscess without bleeding: Secondary | ICD-10-CM | POA: Diagnosis not present

## 2020-12-12 DIAGNOSIS — K5792 Diverticulitis of intestine, part unspecified, without perforation or abscess without bleeding: Secondary | ICD-10-CM | POA: Diagnosis not present

## 2020-12-14 DIAGNOSIS — K5730 Diverticulosis of large intestine without perforation or abscess without bleeding: Secondary | ICD-10-CM | POA: Diagnosis not present

## 2020-12-14 DIAGNOSIS — K5792 Diverticulitis of intestine, part unspecified, without perforation or abscess without bleeding: Secondary | ICD-10-CM | POA: Diagnosis not present

## 2021-01-17 DIAGNOSIS — K5730 Diverticulosis of large intestine without perforation or abscess without bleeding: Secondary | ICD-10-CM | POA: Diagnosis not present
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
## 2021-01-22 ENCOUNTER — Encounter: Payer: Self-pay | Admitting: Cardiovascular Disease

## 2021-01-22 ENCOUNTER — Ambulatory Visit (INDEPENDENT_AMBULATORY_CARE_PROVIDER_SITE_OTHER): Payer: Medicare Other | Admitting: Cardiovascular Disease

## 2021-01-22 ENCOUNTER — Other Ambulatory Visit: Payer: Self-pay

## 2021-01-22 VITALS — BP 132/86 | HR 55 | Ht 72.0 in | Wt 233.2 lb

## 2021-01-22 DIAGNOSIS — E785 Hyperlipidemia, unspecified: Secondary | ICD-10-CM

## 2021-01-22 DIAGNOSIS — Z951 Presence of aortocoronary bypass graft: Secondary | ICD-10-CM

## 2021-01-22 MED ORDER — SPIRONOLACTONE 25 MG PO TABS: 25.0000 mg | ORAL_TABLET | Freq: Every day | ORAL | 3 refills | Status: DC

## 2021-01-22 MED ORDER — HYDROCHLOROTHIAZIDE 25 MG PO TABS: 25.0000 mg | ORAL_TABLET | Freq: Every day | ORAL | 3 refills | Status: DC

## 2021-01-22 NOTE — Patient Instructions (Addendum)

## 2021-01-25 ENCOUNTER — Telehealth: Payer: Self-pay

## 2021-01-25 ENCOUNTER — Other Ambulatory Visit: Payer: Self-pay | Admitting: Cardiovascular Disease

## 2021-01-25 ENCOUNTER — Other Ambulatory Visit: Payer: Self-pay | Admitting: Pharmacist

## 2021-01-25 MED ORDER — REPATHA SURECLICK 140 MG/ML ~~LOC~~ SOAJ: SUBCUTANEOUS | 11 refills | Status: DC

## 2021-01-25 NOTE — Telephone Encounter (Signed)
-----   Message from Leeroy Bock, [place] sent at 01/25/2021  4:00 PM EST ----- Are you able to reach out to this pt? We got a refill request for Praluent when pt has been on Repatha for over a year. I tried sending in another Repatha refill and got this message back from the pharmacy: "Pharmacy comment: Alternative Requested:INSURANCE WILL NOT [place]. THEY WANT [place] PATIENT HAS BEEN ON REPATHA. YOU WILL HAVE TO CALL INSURANCE."  Are you able to call him to see why he wants to change from [place] to [place]? Praluent will need a prior authorization and may not be covered unless he's had a side effect with Repatha.

## 2021-01-25 NOTE — Telephone Encounter (Signed)
Ok to change to Praluent, recommend higher 150mg  twice monthly dose due to baseline LDL of 176.

## 2021-01-25 NOTE — Telephone Encounter (Signed)
[name] Key: [id number] PA FOR PRALUENT 150 SUBMITTED

## 2021-01-25 NOTE — Telephone Encounter (Signed)
Pt on Repatha for > 1 year, now requesting Praluent. Msg sent to [facility] to follow up with pt regarding any tolerability issues as Praluent will need a PA and may not be covered unless pt has experienced side effects on Repatha.

## 2021-01-25 NOTE — Telephone Encounter (Signed)
Called and asked pt if they changed drug insurance and they stated yes  OF:[id number] [place]: [id number] PCN: [id number]: [phone] Insurance will require change to praluent will route to megan supple to make sure that is ok prior to proceeding

## 2021-01-26 MED ORDER — PRALUENT 150 MG/ML ~~LOC~~ SOAJ: 1.0000 "pen " | SUBCUTANEOUS | 11 refills | Status: DC

## 2021-01-26 NOTE — Telephone Encounter (Signed)
Praluent prior authorization approved until further notice. Rx sent to pharmacy and pt made aware.

## 2021-01-26 NOTE — Addendum Note (Signed)
Addended by: Porfiria Heinrich E on: 01/26/2021 08:27 AM   Modules accepted: Orders

## 2021-03-05 DIAGNOSIS — J301 Allergic rhinitis due to pollen: Secondary | ICD-10-CM | POA: Diagnosis not present

## 2021-03-05 DIAGNOSIS — J019 Acute sinusitis, unspecified: Secondary | ICD-10-CM | POA: Diagnosis not present

## 2021-03-05 DIAGNOSIS — B9689 Other specified bacterial agents as the cause of diseases classified elsewhere: Secondary | ICD-10-CM | POA: Diagnosis not present

## 2021-03-05 DIAGNOSIS — M19131 Post-traumatic osteoarthritis, right wrist: Secondary | ICD-10-CM | POA: Diagnosis not present

## 2021-03-26 DIAGNOSIS — M25831 Other specified joint disorders, right wrist: Secondary | ICD-10-CM | POA: Diagnosis not present

## 2021-03-26 DIAGNOSIS — M19131 Post-traumatic osteoarthritis, right wrist: Secondary | ICD-10-CM | POA: Diagnosis not present

## 2021-03-26 DIAGNOSIS — M25531 Pain in right wrist: Secondary | ICD-10-CM | POA: Diagnosis not present

## 2021-03-28 DIAGNOSIS — M19031 Primary osteoarthritis, right wrist: Secondary | ICD-10-CM | POA: Diagnosis not present

## 2021-03-28 DIAGNOSIS — M21931 Unspecified acquired deformity of right forearm: Secondary | ICD-10-CM | POA: Diagnosis not present

## 2021-03-28 DIAGNOSIS — M11231 Other chondrocalcinosis, right wrist: Secondary | ICD-10-CM | POA: Diagnosis not present

## 2021-03-28 DIAGNOSIS — M1811 Unilateral primary osteoarthritis of first carpometacarpal joint, right hand: Secondary | ICD-10-CM | POA: Diagnosis not present

## 2021-03-28 DIAGNOSIS — M19131 Post-traumatic osteoarthritis, right wrist: Secondary | ICD-10-CM | POA: Diagnosis not present

## 2021-03-28 DIAGNOSIS — M25531 Pain in right wrist: Secondary | ICD-10-CM | POA: Diagnosis not present

## 2021-04-04 ENCOUNTER — Telehealth: Payer: Self-pay | Admitting: *Deleted

## 2021-04-04 DIAGNOSIS — M19131 Post-traumatic osteoarthritis, right wrist: Secondary | ICD-10-CM | POA: Diagnosis not present

## 2021-04-04 NOTE — Telephone Encounter (Signed)
   [facility] Pre-operative Risk Assessment    Patient Name: [name]  DOB: 10-09-1946  MRN: [id number]   HEARTCARE STAFF: - Please ensure there is not already an duplicate clearance open for this procedure. - Under Visit Info/Reason for Call, type in Other and utilize the format Clearance MM/DD/YY or Clearance TBD. Do not use dashes or single digits. - If request is for dental extraction, please clarify the # of teeth to be extracted.  Request for surgical clearance:  1. What type of surgery is being performed? PROXIMAL ROW CARPECTOMY RADIAL STYLOIDECTOMY PI NERVE NEUROECTOMY   2. When is this surgery scheduled? 04/19/21   3. What type of clearance is required (medical clearance vs. Pharmacy clearance to hold med vs. Both)? MEDICAL  4. Are there any medications that need to be held prior to surgery and how long? ASA    5. Practice name and name of physician performing surgery? [facility]; DR. Beaumont   6. What is the office phone number? [phone]   7.   What is the office fax number? [place].   Anesthesia type (None, local, MAC, general) ? [place] 04/04/2021, 2:59 PM  _________________________________________________________________   (provider comments below)

## 2021-04-04 NOTE — Telephone Encounter (Signed)
   Name: [name]  DOB: March 02, 1946  MRN: [id number]   Primary Cardiologist: [name], MD  Chart reviewed as part of pre-operative protocol coverage. Patient was contacted 04/04/2021 in reference to pre-operative risk assessment for pending surgery as outlined below.  [name] was last seen on 01/22/21 by Dr. Johnsie Cancel. Since that day, [name] has done well from a cardiac standpoint. He can complete 4 METs without anginal complaints.  Therefore, based on ACC/AHA guidelines, the patient would be at acceptable risk for the planned procedure without further cardiovascular testing.   The patient was advised that if he develops new symptoms prior to surgery to contact our office to arrange for a follow-up visit, and he verbalized understanding.  If needed, patient can hold aspirin 7 days prior to his upcoming surgery with plans to restart when cleared to do so by his surgeon.  I will route this recommendation to the requesting party via Epic fax function and remove from pre-op pool. Please call with questions.  Abigail Butts, PA-C 04/04/2021, 4:43 PM

## 2021-04-05 ENCOUNTER — Other Ambulatory Visit: Payer: Self-pay | Admitting: Orthopedic Surgery

## 2021-04-12 ENCOUNTER — Encounter (HOSPITAL_BASED_OUTPATIENT_CLINIC_OR_DEPARTMENT_OTHER): Payer: Self-pay | Admitting: Orthopedic Surgery

## 2021-04-12 ENCOUNTER — Other Ambulatory Visit: Payer: Self-pay

## 2021-04-16 ENCOUNTER — Encounter (HOSPITAL_BASED_OUTPATIENT_CLINIC_OR_DEPARTMENT_OTHER)
Admission: RE | Admit: 2021-04-16 | Discharge: 2021-04-16 | Disposition: A | Discharge status: Home / Self Care | Payer: Medicare Other | Source: Ambulatory Visit | Attending: Orthopedic Surgery | Admitting: Orthopedic Surgery

## 2021-04-16 DIAGNOSIS — Z01812 Encounter for preprocedural laboratory examination: Secondary | ICD-10-CM | POA: Insufficient documentation

## 2021-04-16 LAB — BASIC METABOLIC PANEL
Anion gap: 8 (ref 5–15)
BUN: 12 mg/dL (ref 8–23)
CO2: 24 mmol/L (ref 22–32)
Calcium: 9.2 mg/dL (ref 8.9–10.3)
Chloride: 102 mmol/L (ref 98–111)
Creatinine, Ser: 0.84 mg/dL (ref 0.61–1.24)
GFR, Estimated: >60 mL/min (ref >60)
Glucose, Bld: 91 mg/dL (ref 70–99)
Potassium: 4.5 mmol/L (ref 3.5–5.1)
Sodium: 134 mmol/L — ABNORMAL LOW (ref 135–145)

## 2021-04-16 NOTE — Progress Notes (Signed)

## 2021-04-19 ENCOUNTER — Encounter (HOSPITAL_BASED_OUTPATIENT_CLINIC_OR_DEPARTMENT_OTHER)
Admission: RE | Disposition: A | Discharge status: Home / Self Care | Payer: Self-pay | Source: Home / Self Care | Attending: Orthopedic Surgery

## 2021-04-19 ENCOUNTER — Ambulatory Visit (HOSPITAL_BASED_OUTPATIENT_CLINIC_OR_DEPARTMENT_OTHER): Payer: Medicare Other | Admitting: Anesthesiology

## 2021-04-19 ENCOUNTER — Ambulatory Visit (HOSPITAL_BASED_OUTPATIENT_CLINIC_OR_DEPARTMENT_OTHER)
Admission: RE | Admit: 2021-04-19 | Discharge: 2021-04-19 | Disposition: A | Discharge status: Home / Self Care | Payer: Medicare Other | Attending: Orthopedic Surgery | Admitting: Orthopedic Surgery

## 2021-04-19 ENCOUNTER — Other Ambulatory Visit: Payer: Self-pay

## 2021-04-19 ENCOUNTER — Encounter (HOSPITAL_BASED_OUTPATIENT_CLINIC_OR_DEPARTMENT_OTHER): Payer: Self-pay | Admitting: Orthopedic Surgery

## 2021-04-19 DIAGNOSIS — Z881 Allergy status to other antibiotic agents status: Secondary | ICD-10-CM | POA: Insufficient documentation

## 2021-04-19 DIAGNOSIS — M19131 Post-traumatic osteoarthritis, right wrist: Secondary | ICD-10-CM | POA: Diagnosis not present

## 2021-04-19 DIAGNOSIS — M19031 Primary osteoarthritis, right wrist: Secondary | ICD-10-CM | POA: Insufficient documentation

## 2021-04-19 DIAGNOSIS — Z96643 Presence of artificial hip joint, bilateral: Secondary | ICD-10-CM | POA: Diagnosis not present

## 2021-04-19 DIAGNOSIS — Z888 Allergy status to other drugs, medicaments and biological substances status: Secondary | ICD-10-CM | POA: Diagnosis not present

## 2021-04-19 DIAGNOSIS — Z951 Presence of aortocoronary bypass graft: Secondary | ICD-10-CM | POA: Insufficient documentation

## 2021-04-19 DIAGNOSIS — M24131 Other articular cartilage disorders, right wrist: Secondary | ICD-10-CM | POA: Insufficient documentation

## 2021-04-19 DIAGNOSIS — E785 Hyperlipidemia, unspecified: Secondary | ICD-10-CM | POA: Diagnosis not present

## 2021-04-19 DIAGNOSIS — Z885 Allergy status to narcotic agent status: Secondary | ICD-10-CM | POA: Diagnosis not present

## 2021-04-19 DIAGNOSIS — Z87891 Personal history of nicotine dependence: Secondary | ICD-10-CM | POA: Diagnosis not present

## 2021-04-19 DIAGNOSIS — M25831 Other specified joint disorders, right wrist: Secondary | ICD-10-CM | POA: Diagnosis not present

## 2021-04-19 DIAGNOSIS — G8918 Other acute postprocedural pain: Secondary | ICD-10-CM | POA: Diagnosis not present

## 2021-04-19 DIAGNOSIS — I10 Essential (primary) hypertension: Secondary | ICD-10-CM | POA: Diagnosis not present

## 2021-04-19 HISTORY — PX: CARPECTOMY WITH RADIAL STYLOIDECTOMY: SHX5609

## 2021-04-19 SURGERY — CARPECTOMY WITH RADIAL STYLOIDECTOMY
Anesthesia: General | Site: Wrist | Laterality: Right

## 2021-04-19 MED ORDER — CELECOXIB 200 MG PO CAPS: 200.0000 mg | ORAL_CAPSULE | Freq: Two times a day (BID) | ORAL | 0 refills | Status: DC

## 2021-04-19 MED ORDER — FENTANYL CITRATE (PF) 100 MCG/2ML IJ SOLN
INTRAMUSCULAR | Status: AC
  Filled 2021-04-19: qty 2

## 2021-04-19 MED ORDER — FENTANYL CITRATE (PF) 100 MCG/2ML IJ SOLN
INTRAMUSCULAR | Status: DC | PRN
  Administered 2021-04-19 (×4): 25 ug via INTRAVENOUS

## 2021-04-19 MED ORDER — MIDAZOLAM HCL 2 MG/2ML IJ SOLN
2.0000 mg | Freq: Once | INTRAMUSCULAR | Status: AC
  Administered 2021-04-19: 2 mg via INTRAVENOUS

## 2021-04-19 MED ORDER — PROPOFOL 10 MG/ML IV BOLUS
INTRAVENOUS | Status: DC | PRN
  Administered 2021-04-19: 150 mg via INTRAVENOUS

## 2021-04-19 MED ORDER — DEXAMETHASONE SODIUM PHOSPHATE 10 MG/ML IJ SOLN
INTRAMUSCULAR | Status: DC | PRN
  Administered 2021-04-19: 10 mg

## 2021-04-19 MED ORDER — LIDOCAINE HCL (CARDIAC) PF 100 MG/5ML IV SOSY
PREFILLED_SYRINGE | INTRAVENOUS | Status: DC | PRN
  Administered 2021-04-19: 50 mg via INTRAVENOUS

## 2021-04-19 MED ORDER — CEFAZOLIN SODIUM-DEXTROSE 2-4 GM/100ML-% IV SOLN: 2.0000 g | INTRAVENOUS | Status: DC

## 2021-04-19 MED ORDER — FENTANYL CITRATE (PF) 100 MCG/2ML IJ SOLN
100.0000 ug | Freq: Once | INTRAMUSCULAR | Status: AC
Start: 2021-04-19 — End: 2021-04-19
  Administered 2021-04-19: 50 ug via INTRAVENOUS

## 2021-04-19 MED ORDER — LACTATED RINGERS IV SOLN: INTRAVENOUS | Status: DC | PRN

## 2021-04-19 MED ORDER — MIDAZOLAM HCL 2 MG/2ML IJ SOLN
INTRAMUSCULAR | Status: AC
  Filled 2021-04-19: qty 2

## 2021-04-19 MED ORDER — EPHEDRINE SULFATE 50 MG/ML IJ SOLN
INTRAMUSCULAR | Status: DC | PRN
  Administered 2021-04-19: 15 mg via INTRAVENOUS

## 2021-04-19 MED ORDER — ONDANSETRON HCL 4 MG/2ML IJ SOLN: 4.0000 mg | Freq: Once | INTRAMUSCULAR | Status: DC | PRN

## 2021-04-19 MED ORDER — OXYCODONE HCL 5 MG/5ML PO SOLN: 5.0000 mg | Freq: Once | ORAL | Status: DC | PRN

## 2021-04-19 MED ORDER — MEPERIDINE HCL 25 MG/ML IJ SOLN: 6.2500 mg | INTRAMUSCULAR | Status: DC | PRN

## 2021-04-19 MED ORDER — CEFAZOLIN SODIUM-DEXTROSE 2-4 GM/100ML-% IV SOLN
INTRAVENOUS | Status: AC
  Filled 2021-04-19: qty 100

## 2021-04-19 MED ORDER — ONDANSETRON HCL 4 MG/2ML IJ SOLN
INTRAMUSCULAR | Status: DC | PRN
  Administered 2021-04-19: 4 mg via INTRAVENOUS

## 2021-04-19 MED ORDER — PROPOFOL 10 MG/ML IV BOLUS
INTRAVENOUS | Status: AC
  Filled 2021-04-19: qty 20

## 2021-04-19 MED ORDER — CEFAZOLIN SODIUM-DEXTROSE 2-3 GM-%(50ML) IV SOLR
INTRAVENOUS | Status: DC | PRN
  Administered 2021-04-19: 2 g via INTRAVENOUS

## 2021-04-19 MED ORDER — LIDOCAINE 2% (20 MG/ML) 5 ML SYRINGE
INTRAMUSCULAR | Status: AC
  Filled 2021-04-19: qty 5

## 2021-04-19 MED ORDER — LACTATED RINGERS IV SOLN: INTRAVENOUS | Status: DC

## 2021-04-19 MED ORDER — DEXAMETHASONE SODIUM PHOSPHATE 10 MG/ML IJ SOLN
INTRAMUSCULAR | Status: DC | PRN
  Administered 2021-04-19: 5 mg via INTRAVENOUS

## 2021-04-19 MED ORDER — HYDROCODONE-ACETAMINOPHEN 7.5-325 MG/15ML PO SOLN: 15.0000 mL | Freq: Four times a day (QID) | ORAL | 0 refills | Status: AC | PRN

## 2021-04-19 MED ORDER — FENTANYL CITRATE (PF) 100 MCG/2ML IJ SOLN
25.0000 ug | INTRAMUSCULAR | Status: DC | PRN
  Administered 2021-04-19: 25 ug via INTRAVENOUS

## 2021-04-19 MED ORDER — ACETAMINOPHEN 160 MG/5ML PO SOLN
325.0000 mg | ORAL | Status: DC | PRN
Start: 2021-04-19 — End: 2021-04-19

## 2021-04-19 MED ORDER — ACETAMINOPHEN 325 MG PO TABS: 325.0000 mg | ORAL_TABLET | ORAL | Status: DC | PRN

## 2021-04-19 MED ORDER — ROPIVACAINE HCL 7.5 MG/ML IJ SOLN
INTRAMUSCULAR | Status: DC | PRN
  Administered 2021-04-19: 20 mL via PERINEURAL

## 2021-04-19 MED ORDER — OXYCODONE HCL 5 MG PO TABS: 5.0000 mg | ORAL_TABLET | Freq: Once | ORAL | Status: DC | PRN

## 2021-04-19 SURGICAL SUPPLY — 61 items
APL PRP STRL LF DISP 70% ISPRP (MISCELLANEOUS) ×2
BLADE ARTHRO LOK 4 BEAVER (BLADE) ×3 IMPLANT
BLADE MINI RND TIP GREEN BEAV (BLADE) ×3 IMPLANT
BLADE SURG 15 STRL LF DISP TIS (BLADE) ×2 IMPLANT
BLADE SURG 15 STRL SS (BLADE) ×3
BNDG CMPR 9X4 STRL LF SNTH (GAUZE/BANDAGES/DRESSINGS) ×2
BNDG COHESIVE 1X5 TAN STRL LF (GAUZE/BANDAGES/DRESSINGS) IMPLANT
BNDG COHESIVE 2X5 TAN STRL LF (GAUZE/BANDAGES/DRESSINGS) IMPLANT
BNDG COHESIVE 3X5 TAN STRL LF (GAUZE/BANDAGES/DRESSINGS) ×3 IMPLANT
BNDG ESMARK 4X9 LF (GAUZE/BANDAGES/DRESSINGS) ×3 IMPLANT
BNDG GAUZE ELAST 4 BULKY (GAUZE/BANDAGES/DRESSINGS) ×3 IMPLANT
CHLORAPREP W/TINT 26 (MISCELLANEOUS) ×3 IMPLANT
CORD BIPOLAR FORCEPS 12FT (ELECTRODE) ×3 IMPLANT
COVER BACK TABLE 60X90IN (DRAPES) ×3 IMPLANT
COVER MAYO STAND STRL (DRAPES) ×3 IMPLANT
COVER WAND RF STERILE (DRAPES) IMPLANT
CUFF TOURN SGL QUICK 18X4 (TOURNIQUET CUFF) IMPLANT
DECANTER SPIKE VIAL GLASS SM (MISCELLANEOUS) IMPLANT
DRAIN PENROSE 1/2X12 LTX STRL (WOUND CARE) IMPLANT
DRAPE EXTREMITY T 121X128X90 (DISPOSABLE) ×3 IMPLANT
DRAPE OEC MINIVIEW 54X84 (DRAPES) ×3 IMPLANT
DRAPE SURG 17X23 STRL (DRAPES) ×3 IMPLANT
GAUZE SPONGE 4X4 12PLY STRL (GAUZE/BANDAGES/DRESSINGS) ×3 IMPLANT
GAUZE XEROFORM 1X8 LF (GAUZE/BANDAGES/DRESSINGS) ×3 IMPLANT
GLOVE SRG 8 PF TXTR STRL LF DI (GLOVE) ×2 IMPLANT
GLOVE SURG ENC MOIS LTX SZ6.5 (GLOVE) ×6 IMPLANT
GLOVE SURG ENC MOIS LTX SZ7.5 (GLOVE) ×3 IMPLANT
GLOVE SURG ORTHO LTX SZ8 (GLOVE) ×3 IMPLANT
GLOVE SURG UNDER POLY LF SZ7 (GLOVE) ×3 IMPLANT
GLOVE SURG UNDER POLY LF SZ8 (GLOVE) ×3
GLOVE SURG UNDER POLY LF SZ8.5 (GLOVE) ×3 IMPLANT
GOWN STRL REUS W/ TWL LRG LVL3 (GOWN DISPOSABLE) ×2 IMPLANT
GOWN STRL REUS W/TWL LRG LVL3 (GOWN DISPOSABLE) ×3
GOWN STRL REUS W/TWL XL LVL3 (GOWN DISPOSABLE) ×6 IMPLANT
NDL SAFETY ECLIPSE 18X1.5 (NEEDLE) ×2 IMPLANT
NEEDLE HYPO 18GX1.5 SHARP (NEEDLE) ×3
NEEDLE PRECISIONGLIDE 27X1.5 (NEEDLE) IMPLANT
NS IRRIG 1000ML POUR BTL (IV SOLUTION) ×3 IMPLANT
PACK BASIN DAY SURGERY FS (CUSTOM PROCEDURE TRAY) ×3 IMPLANT
PAD CAST 3X4 CTTN HI CHSV (CAST SUPPLIES) ×2 IMPLANT
PADDING CAST ABS 3INX4YD NS (CAST SUPPLIES)
PADDING CAST ABS 4INX4YD NS (CAST SUPPLIES) ×1
PADDING CAST ABS COTTON 3X4 (CAST SUPPLIES) IMPLANT
PADDING CAST ABS COTTON 4X4 ST (CAST SUPPLIES) ×2 IMPLANT
PADDING CAST COTTON 3X4 STRL (CAST SUPPLIES) ×3
SLEEVE SCD COMPRESS KNEE MED (STOCKING) ×3 IMPLANT
SLING ARM FOAM STRAP LRG (SOFTGOODS) ×3 IMPLANT
SPLINT PLASTER CAST XFAST 3X15 (CAST SUPPLIES) ×20 IMPLANT
SPLINT PLASTER XTRA FASTSET 3X (CAST SUPPLIES) ×10
STOCKINETTE 4X48 STRL (DRAPES) ×3 IMPLANT
SUT ETHILON 4 0 PS 2 18 (SUTURE) ×3 IMPLANT
SUT VIC AB 0 CT1 27 (SUTURE)
SUT VIC AB 0 CT1 27XBRD ANBCTR (SUTURE) IMPLANT
SUT VIC AB 2-0 SH 27 (SUTURE)
SUT VIC AB 2-0 SH 27XBRD (SUTURE) IMPLANT
SUT VIC AB 4-0 P2 18 (SUTURE) IMPLANT
SUT VICRYL 4-0 PS2 18IN ABS (SUTURE) ×3 IMPLANT
SYR BULB EAR ULCER 3OZ GRN STR (SYRINGE) ×3 IMPLANT
SYR CONTROL 10ML LL (SYRINGE) IMPLANT
TOWEL GREEN STERILE FF (TOWEL DISPOSABLE) ×6 IMPLANT
UNDERPAD 30X36 HEAVY ABSORB (UNDERPADS AND DIAPERS) ×3 IMPLANT

## 2021-04-19 NOTE — Anesthesia Postprocedure Evaluation (Signed)
Anesthesia Post Note  Patient: [name]  Procedure(s) Performed: CARPECTOMY WITH RADIAL STYLOIDECTOMY, and posterior interosseous nerve neurectomy (Right Wrist)     Patient location during evaluation: PACU Anesthesia Type: General Level of consciousness: awake and alert Pain management: pain level controlled Vital Signs Assessment: post-procedure vital signs reviewed and stable Respiratory status: spontaneous breathing, nonlabored ventilation, respiratory function stable and patient connected to nasal cannula oxygen Cardiovascular status: blood pressure returned to baseline and stable Postop Assessment: no apparent nausea or vomiting Anesthetic complications: no   No complications documented.  Last Vitals:  Vitals:   04/19/21 1210 04/19/21 1431  BP: (!) 95/59 131/77  Pulse: 61 61  Resp: 18 12  Temp:  36.5 C  SpO2: 96% 93%    Last Pain:  Vitals:   04/19/21 1431  TempSrc:   PainSc: Asleep                 Charlet Harr

## 2021-04-19 NOTE — H&P (Signed)
[name] is an 75 y.o. male.   Chief Complaint: right wrist pain HPI: [name] is a 75 year old right-hand-dominant male referred by Dr. Joie Bimler  for recurrent pain in his right wrist is been going on for past year. This is sharp throbbing in nature with a VAS score 7/10. He has been taking ibuprofen and wearing a wrist. He states this is giving him some support. He recalls no history of injury he has been using Voltaren gel. Complains of intermittent pain with any use in the rhexis on centrally dorsally on his wrist. He has a history of arthritis no history of diabetes thyroid problems or gout. Family history is positive diabetes. He has been tested.He was referred for CT scan to evaluate his proximal capitate. He was placed on Celebrex. He states that splinting and the anti-inflammatory have helped but his symptoms he takes it off his pain is back moderate to severe in nature.       Past Medical History:  Diagnosis Date  . Acid reflux   . CAD (coronary artery disease)    a. s/p CABG x 1 (LIMA->LAD);  b. 04/2013 Ex MV: Ex time 43mins, no isch/infarct, EF 60%.  Marland Kitchen HTN (hypertension)   . Hyperlipidemia    intolerant of all statins due to muscle aches  . OSA (obstructive sleep apnea)    resolved with weight loss  . Prostate cancer [facility])    s/p resection  . S/P colonoscopy with polypectomy     Past Surgical History:  Procedure Laterality Date  . CHOLECYSTECTOMY    . CORONARY ARTERY BYPASS GRAFT  2002  . CORONARY STENT PLACEMENT    . TOTAL HIP ARTHROPLASTY Bilateral     Family History  Problem Relation Age of Onset  . Diabetes Father   . Coronary artery disease Father   . Hypertension Father   . Cancer Sister   . Arthritis Mother    Social History:  reports that he quit smoking about 33 years ago. He has never used smokeless tobacco. He reports current alcohol use. He reports that he does not use drugs.  Allergies:  Allergies  Allergen Reactions  . Codeine Nausea And  Vomiting  . Nabumetone     "FELT LIKE I WAS GOING TO PASS OUT"  . Oxycodone Nausea And Vomiting  . Statins Other (See Comments)    Cramping with Crestor, chest pain with Lipitor  . Zolpidem Tartrate Other (See Comments)    Goes wild  . Sulfamethoxazole-Trimethoprim Nausea Only    No medications prior to admission.    No results found for this or any previous visit (from the past 48 hour(s)).  No results found.   Pertinent items are noted in HPI.  Height 6' (1.829 m), weight 106.6 kg.  General appearance: alert, cooperative and appears stated age Head: Normocephalic, without obvious abnormality Neck: no JVD Resp: clear to auscultation bilaterally Cardio: regular rate and rhythm, S1, S2 normal, no murmur, click, rub or gallop GI: soft, non-tender; bowel sounds normal; no masses,  no organomegaly Extremities: right wrist pain Pulses: 2+ and symmetric Skin: Skin color, texture, turgor normal. No rashes or lesions Neurologic: Grossly normal Incision/Wound: na  Assessment/Plan Diagnosed scapholunate advanced collapse right wrist  Plan: We have discussed with him his CT scan which reveals capitate appear to be intact. We have discussed ulnar for bone fusion scaphoid excision risks and complications of each that the overall result is equivalent following each surgery but there is a chance of nonunion with  the ulnar for bone fusion. He is aware that there is no guarantee to the surgery possibility of infection recurrence injury to arteries nerves tendons incomplete relief symptoms dystrophy. He would like to proceed with proximal carpectomy. This will be done along with a radial styloidectomy and posterior interosseous nerve resection for preoperative pain. This to be scheduled as an outpatient under regional anesthesia. Questions are encouraged and answered to his and his wife satisfaction.     [name] 04/19/2021, 6:01 AM

## 2021-04-19 NOTE — Anesthesia Procedure Notes (Signed)
Anesthesia Regional Block: Axillary brachial plexus block   Pre-Anesthetic Checklist: ,, timeout performed, Correct Patient, Correct Site, Correct Laterality, Correct Procedure, Correct Position, site marked, Risks and benefits discussed,  Surgical consent,  Pre-op evaluation,  At surgeon's request and post-op pain management  Laterality: Right  Prep: chloraprep       Needles:  Injection technique: Single-shot  Needle Type: Echogenic Stimulator Needle     Needle Length: 5cm  Needle Gauge: 22     Additional Needles:   Procedures:, nerve stimulator,,, ultrasound used (permanent image in chart),,,,  Narrative:  Start time: 04/19/2021 12:10 PM End time: 04/19/2021 12:15 PM Injection made incrementally with aspirations every 5 mL.  Performed by: Personally  Anesthesiologist: Janeece Riggers, MD  Additional Notes: Functioning IV was confirmed and monitors were applied.  A 48mm 22ga Arrow echogenic stimulator needle was used. Sterile prep and drape,hand hygiene and sterile gloves were used. Ultrasound guidance: relevant anatomy identified, needle position confirmed, local anesthetic spread visualized around nerve(s)., vascular puncture avoided.  Image printed for medical record. Negative aspiration and negative test dose prior to incremental administration of local anesthetic. The patient tolerated the procedure well.

## 2021-04-19 NOTE — Brief Op Note (Signed)
04/19/2021  2:26 PM  PATIENT:  [name]  75 y.o. male  PRE-OPERATIVE DIAGNOSIS:  SCAPHOLUNATE ADVANCED COLLAPSED RIGHT WRIST  POST-OPERATIVE DIAGNOSIS:  SCAPHOLUNATE ADVANCED COLLAPSED RIGHT WRIST  PROCEDURE:  Procedure(s) with comments: CARPECTOMY WITH RADIAL STYLOIDECTOMY, and posterior interosseous nerve neurectomy (Right) - AXILLARY BLOCK  SURGEON:  Surgeon(s) and Role:    * Daryll Brod, MD - Primary    * Leanora Cover, MD - Assisting  PHYSICIAN ASSISTANT:   ASSISTANTS: Richardo Priest, MD   ANESTHESIA:   regional and IV sedation  EBL:  15 mL   BLOOD ADMINISTERED:none  DRAINS: none   LOCAL MEDICATIONS USED:  NONE  SPECIMEN:  No Specimen  DISPOSITION OF SPECIMEN:  N/A  COUNTS:  YES  TOURNIQUET:   Total Tourniquet Time Documented: Upper Arm (Right) - 48 minutes Total: Upper Arm (Right) - 48 minutes   DICTATION: .Viviann Spare Dictation  PLAN OF CARE: Discharge to home after PACU  PATIENT DISPOSITION:  PACU - hemodynamically stable.

## 2021-04-19 NOTE — Transfer of Care (Signed)
Immediate Anesthesia Transfer of Care Note  Patient: [name]  Procedure(s) Performed: CARPECTOMY WITH RADIAL STYLOIDECTOMY, and posterior interosseous nerve neurectomy (Right Wrist)  Patient Location: PACU  Anesthesia Type:General and Regional  Level of Consciousness: awake, alert  and oriented  Airway & Oxygen Therapy: Patient Spontanous Breathing and Patient connected to face mask oxygen  Post-op Assessment: Report given to RN and Post -op Vital signs reviewed and stable  Post vital signs: Reviewed and stable  Last Vitals:  Vitals Value Taken Time  BP    Temp    Pulse 61 04/19/21 1432  Resp    SpO2 93 % 04/19/21 1432  Vitals shown include unvalidated device data.  Last Pain:  Vitals:   04/19/21 1116  TempSrc: Oral  PainSc: 0-No pain         Complications: No complications documented.

## 2021-04-19 NOTE — Discharge Instructions (Addendum)
Hand Center Instructions Hand Surgery  Wound Care: Keep your hand elevated above the level of your heart.  Do not allow it to dangle by your side.  Keep the dressing dry and do not remove it unless your doctor advises you to do so.  He will usually change it at the time of your post-op visit.  Moving your fingers is advised to stimulate circulation but will depend on the site of your surgery.  If you have a splint applied, your doctor will advise you regarding movement.  Activity: Do not drive or operate machinery today.  Rest today and then you may return to your normal activity and work as indicated by your physician.  Diet:  Drink liquids today or eat a light diet.  You may resume a regular diet tomorrow.    General expectations: Pain for two to three days. Fingers may become slightly swollen.  Call your doctor if any of the following occur: Severe pain not relieved by pain medication. Elevated temperature. Dressing soaked with blood. Inability to move fingers. White or bluish color to fingers.   Post Anesthesia Home Care Instructions  Activity: Get plenty of rest for the remainder of the day. A responsible individual must stay with you for 24 hours following the procedure.  For the next 24 hours, DO NOT: -Drive a car -Paediatric nurse -Drink alcoholic beverages -Take any medication unless instructed by your physician -Make any legal decisions or sign important papers.  Meals: Start with liquid foods such as gelatin or soup. Progress to regular foods as tolerated. Avoid greasy, spicy, heavy foods. If nausea and/or vomiting occur, drink only clear liquids until the nausea and/or vomiting subsides. Call your physician if vomiting continues.  Special Instructions/Symptoms: Your throat may feel dry or sore from the anesthesia or the breathing tube placed in your throat during surgery. If this causes discomfort, gargle with warm salt water. The discomfort should disappear within  24 hours.  If you had a scopolamine patch placed behind your ear for the management of post- operative nausea and/or vomiting:  1. The medication in the patch is effective for 72 hours, after which it should be removed.  Wrap patch in a tissue and discard in the trash. Wash hands thoroughly with soap and water. 2. You may remove the patch earlier than 72 hours if you experience unpleasant side effects which may include dry mouth, dizziness or visual disturbances. 3. Avoid touching the patch. Wash your hands with soap and water after contact with the patch.      Post Anesthesia Home Care Instructions  Activity: Get plenty of rest for the remainder of the day. A responsible individual must stay with you for 24 hours following the procedure.  For the next 24 hours, DO NOT: -Drive a car -Paediatric nurse -Drink alcoholic beverages -Take any medication unless instructed by your physician -Make any legal decisions or sign important papers.  Meals: Start with liquid foods such as gelatin or soup. Progress to regular foods as tolerated. Avoid greasy, spicy, heavy foods. If nausea and/or vomiting occur, drink only clear liquids until the nausea and/or vomiting subsides. Call your physician if vomiting continues.  Special Instructions/Symptoms: Your throat may feel dry or sore from the anesthesia or the breathing tube placed in your throat during surgery. If this causes discomfort, gargle with warm salt water. The discomfort should disappear within 24 hours.  If you had a scopolamine patch placed behind your ear for the management of post- operative nausea and/or  vomiting:  1. The medication in the patch is effective for 72 hours, after which it should be removed.  Wrap patch in a tissue and discard in the trash. Wash hands thoroughly with soap and water. 2. You may remove the patch earlier than 72 hours if you experience unpleasant side effects which may include dry mouth, dizziness or visual  disturbances. 3. Avoid touching the patch. Wash your hands with soap and water after contact with the patch.    Regional Anesthesia Blocks  1. Numbness or the inability to move the "blocked" extremity may last from 3-48 hours after placement. The length of time depends on the medication injected and your individual response to the medication. If the numbness is not going away after 48 hours, call your surgeon.  2. The extremity that is blocked will need to be protected until the numbness is gone and the  Strength has returned. Because you cannot feel it, you will need to take extra care to avoid injury. Because it may be weak, you may have difficulty moving it or using it. You may not know what position it is in without looking at it while the block is in effect.  3. For blocks in the legs and feet, returning to weight bearing and walking needs to be done carefully. You will need to wait until the numbness is entirely gone and the strength has returned. You should be able to move your leg and foot normally before you try and bear weight or walk. You will need someone to be with you when you first try to ensure you do not fall and possibly risk injury.  4. Bruising and tenderness at the needle site are common side effects and will resolve in a few days.  5. Persistent numbness or new problems with movement should be communicated to the surgeon or the [facility] [phone] [place] [phone]).

## 2021-04-19 NOTE — Progress Notes (Signed)
Assisted Dr. Oddono with right, ultrasound guided, axillary block. Side rails up, monitors on throughout procedure. See vital signs in flow sheet. Tolerated Procedure well. 

## 2021-04-19 NOTE — Op Note (Signed)
I assisted Surgeon(s) and Role:    * Daryll Brod, MD - Primary    Leanora Cover, MD - Assisting on the Procedure(s): CARPECTOMY WITH RADIAL STYLOIDECTOMY, and posterior interosseous nerve neurectomy on 04/19/2021.  I provided assistance on this case as follows: retraction soft tissues, extraction bone.  Electronically signed by: Leanora Cover, MD Date: 04/19/2021 Time: 2:26 PM

## 2021-04-19 NOTE — Anesthesia Preprocedure Evaluation (Addendum)
Anesthesia Evaluation  Patient identified by MRN, date of birth, ID band Patient awake    Reviewed: Allergy & Precautions, H&P , NPO status , Patient's Chart, lab work & pertinent test results, reviewed documented beta blocker date and time   Airway Mallampati: III  TM Distance: >3 FB Neck ROM: full    Dental no notable dental hx. (+) Teeth Intact, Partial Upper, Implants, Caps, Dental Advisory Given   Pulmonary former smoker,    Pulmonary exam normal breath sounds clear to auscultation       Cardiovascular Exercise Tolerance: Good hypertension, Pt. on medications and Pt. on home beta blockers + CAD and + CABG   Rhythm:regular Rate:Normal     Neuro/Psych negative neurological ROS  negative psych ROS   GI/Hepatic Neg liver ROS, GERD  ,  Endo/Other  negative endocrine ROS  Renal/GU negative Renal ROS  negative genitourinary   Musculoskeletal   Abdominal   Peds  Hematology negative hematology ROS (+)   Anesthesia Other Findings   Reproductive/Obstetrics negative OB ROS                            Anesthesia Physical Anesthesia Plan  ASA: III  Anesthesia Plan: General   Post-op Pain Management: GA combined w/ Regional for post-op pain   Induction: Intravenous  PONV Risk Score and Plan: 2 and Ondansetron, Dexamethasone and Treatment may vary due to age or medical condition  Airway Management Planned: LMA  Additional Equipment: None  Intra-op Plan:   Post-operative Plan: Extubation in OR  Informed Consent: I have reviewed the patients History and Physical, chart, labs and discussed the procedure including the risks, benefits and alternatives for the proposed anesthesia with the patient or authorized representative who has indicated his/her understanding and acceptance.     Dental Advisory Given  Plan Discussed with: CRNA and Anesthesiologist  Anesthesia Plan Comments:  (Discussed both nerve block for pain relief post-op and GA; including NV, sore throat, dental injury, and pulmonary complications)       Anesthesia Quick Evaluation

## 2021-04-19 NOTE — Anesthesia Procedure Notes (Signed)
Procedure Name: LMA Insertion Performed by: Verita Lamb, CRNA Pre-anesthesia Checklist: Patient identified, Emergency Drugs available, Suction available and Patient being monitored Patient Re-evaluated:Patient Re-evaluated prior to induction Oxygen Delivery Method: Circle system utilized Preoxygenation: Pre-oxygenation with 100% oxygen Induction Type: IV induction Ventilation: Mask ventilation without difficulty LMA: LMA inserted LMA Size: 5.0 Number of attempts: 1 Airway Equipment and Method: Bite block Placement Confirmation: positive ETCO2,  CO2 detector and breath sounds checked- equal and bilateral Tube secured with: Tape Dental Injury: Teeth and Oropharynx as per pre-operative assessment

## 2021-04-19 NOTE — Op Note (Addendum)
NAME: [name] RECORD NO: [id number] DATE OF BIRTH: [birth year] FACILITY: [facility] LOCATION: [facility] PHYSICIAN: Wynonia Sours, MD   OPERATIVE REPORT   DATE OF PROCEDURE: 04/19/21    PREOPERATIVE DIAGNOSIS:   Scapholunate advanced collapse right wrist   POSTOPERATIVE DIAGNOSIS:   Same   PROCEDURE:   Proximal carpectomy right wrist with radial styloidectomy and posterior interosseous nerve resection for preoperative pain   SURGEON: Daryll Brod, M.D.   ASSISTANT: Leanora Cover, MD   ANESTHESIA:  Regional with sedation   INTRAVENOUS FLUIDS:  Per anesthesia flow sheet.   ESTIMATED BLOOD LOSS:  Minimal.   COMPLICATIONS:  None.   SPECIMENS:  none   TOURNIQUET TIME:    Total Tourniquet Time Documented: Upper Arm (Right) - 48 minutes Total: Upper Arm (Right) - 48 minutes    DISPOSITION:  Stable to PACU.   INDICATIONS: Patient is a 75 year old male with a long history of pain in his right wrist not responsive to conservative treatment x-rays reveal a scapholunate advanced collapse CT scan reveals maintenance of the volar surface of the proximal capitate released on the radial side.  He is admitted for proximal carpectomy right wrist.  Pre-.  Postoperative course been discussed along with risks and complications.  He is aware that there is no guarantee to the surgery the possibility of infection recurrence injury to arteries nerves tendons incomplete relief symptoms and dystrophy.  In the preoperative area the patient is seen the extremity marked by both patient and surgeon antibiotic given.  A supraclavicular clavicular block was carried out without difficulty under the direction the anesthesia department.  OPERATIVE COURSE: Patient is brought to the operating room placed in the supine position with the right arm free.  He was prepped and draped with ChloraPrep.  3-minute dry time was allowed and timeout taken to confirm patient procedure.  The limb was  exsanguinated with an Esmarch bandage turn placed on the arm was inflated to 250 mmHg or l longitudinal incision was made just ulnar to the Lister's tubercle to protect the subcutaneous tissue.  Bleeders were electrocauterized with bipolar the extensor retinaculum was then incised from a distal to proximal direction taking care to protect the extensor pollicis longus tendon.  The posterior interosseous nerve was identified this was found to be significantly thickened distally.  This was resected for approximately 3 cm.  It was done with bipolar.  A longitudinal incision was made in the To the radial side of the capitate.  This allowed visualization of the scapholunate ligament injury.  The ulnar half of the capitate showed degenerative changes radial side did not.  There was complete arthritic changes over the entire radial scaphoid facet.  The scaphoid was markedly rotated.  The scaphoid was then removed in a piecemeal manner using rongeurs curettes protecting the radioscaphocapitate ligament.  On removal of the scaphoid which showed arthritis along the entire radial border.  The lunate was then removed after incising the lunotriquetral ligament.  This was done by placing a large towel clip manipulating the bone protecting the articular surface of the radius and the lunate facet.  This was done with blunt sharp dissection removing the bone in a piecemeal manner.  The triquetrum was then isolated with blunt sharp dissection.  This was then removed in a piecemeal manner also.  There was taken to protect the volar ulnar ligaments in addition.  Wound was copiously irrigated with saline.  Retractors were placed allowing visualization of the radial A4  and facet which was markedly arthritic and the styloid was markedly enlarged and this was isolated and removed with a rondure so that there was no impingement.  The capitate immediately translate located into the lunate facet of the distal radius and this was confirmed with  image intensification.  Wound was copiously irrigated with saline.  The capsule was then repaired with figure-of-eight 2-0 Mersilene sutures.  The extensor retinaculum was then repaired distally released Lister's tubercle with 4-0 Vicryl sutures.  The subcutaneous tissue was closed with interrupted 4-0 Vicryl and the skin with interrupted 4-0 nylon sutures.  A sterile compressive dressing and dorsal palmar splint was applied.  Following application of the splint prior to removal from the operating room x-rays taken AP lateral oblique revealed that the capitate lied in the lunate facet both AP and lateral directions.  Patient was taken to the recovery room for discharge.  He will be discharged home to return to the hand center of [facility] in 1 week on Celebrex twice daily 200 mg and a prescription for Norco 7.5 325's.Daryll Brod, MD Electronically signed, 04/19/21

## 2021-04-20 ENCOUNTER — Encounter (HOSPITAL_BASED_OUTPATIENT_CLINIC_OR_DEPARTMENT_OTHER): Payer: Self-pay | Admitting: Orthopedic Surgery

## 2021-04-27 DIAGNOSIS — M19131 Post-traumatic osteoarthritis, right wrist: Secondary | ICD-10-CM | POA: Diagnosis not present

## 2021-05-07 DIAGNOSIS — M19131 Post-traumatic osteoarthritis, right wrist: Secondary | ICD-10-CM | POA: Diagnosis not present

## 2021-05-21 DIAGNOSIS — C61 Malignant neoplasm of prostate: Secondary | ICD-10-CM | POA: Diagnosis not present

## 2021-06-05 DIAGNOSIS — M25521 Pain in right elbow: Secondary | ICD-10-CM | POA: Diagnosis not present

## 2021-06-05 DIAGNOSIS — M6281 Muscle weakness (generalized): Secondary | ICD-10-CM | POA: Diagnosis not present

## 2021-06-05 DIAGNOSIS — M25431 Effusion, right wrist: Secondary | ICD-10-CM | POA: Diagnosis not present

## 2021-06-05 DIAGNOSIS — M25631 Stiffness of right wrist, not elsewhere classified: Secondary | ICD-10-CM | POA: Diagnosis not present

## 2021-06-08 DIAGNOSIS — M25631 Stiffness of right wrist, not elsewhere classified: Secondary | ICD-10-CM | POA: Diagnosis not present

## 2021-06-08 DIAGNOSIS — M25521 Pain in right elbow: Secondary | ICD-10-CM | POA: Diagnosis not present

## 2021-06-08 DIAGNOSIS — M25431 Effusion, right wrist: Secondary | ICD-10-CM | POA: Diagnosis not present

## 2021-06-08 DIAGNOSIS — M6281 Muscle weakness (generalized): Secondary | ICD-10-CM | POA: Diagnosis not present

## 2021-06-11 DIAGNOSIS — M25431 Effusion, right wrist: Secondary | ICD-10-CM | POA: Diagnosis not present

## 2021-06-11 DIAGNOSIS — M6281 Muscle weakness (generalized): Secondary | ICD-10-CM | POA: Diagnosis not present

## 2021-06-11 DIAGNOSIS — M25631 Stiffness of right wrist, not elsewhere classified: Secondary | ICD-10-CM | POA: Diagnosis not present

## 2021-06-11 DIAGNOSIS — M25521 Pain in right elbow: Secondary | ICD-10-CM | POA: Diagnosis not present

## 2021-06-13 DIAGNOSIS — M25521 Pain in right elbow: Secondary | ICD-10-CM | POA: Diagnosis not present

## 2021-06-13 DIAGNOSIS — M6281 Muscle weakness (generalized): Secondary | ICD-10-CM | POA: Diagnosis not present

## 2021-06-13 DIAGNOSIS — M25431 Effusion, right wrist: Secondary | ICD-10-CM | POA: Diagnosis not present

## 2021-06-13 DIAGNOSIS — M25631 Stiffness of right wrist, not elsewhere classified: Secondary | ICD-10-CM | POA: Diagnosis not present

## 2021-06-15 DIAGNOSIS — M25521 Pain in right elbow: Secondary | ICD-10-CM | POA: Diagnosis not present

## 2021-06-15 DIAGNOSIS — M25631 Stiffness of right wrist, not elsewhere classified: Secondary | ICD-10-CM | POA: Diagnosis not present

## 2021-06-15 DIAGNOSIS — M25431 Effusion, right wrist: Secondary | ICD-10-CM | POA: Diagnosis not present

## 2021-06-15 DIAGNOSIS — M6281 Muscle weakness (generalized): Secondary | ICD-10-CM | POA: Diagnosis not present

## 2021-06-18 DIAGNOSIS — M6281 Muscle weakness (generalized): Secondary | ICD-10-CM | POA: Diagnosis not present

## 2021-06-18 DIAGNOSIS — M25431 Effusion, right wrist: Secondary | ICD-10-CM | POA: Diagnosis not present

## 2021-06-18 DIAGNOSIS — M25631 Stiffness of right wrist, not elsewhere classified: Secondary | ICD-10-CM | POA: Diagnosis not present

## 2021-06-18 DIAGNOSIS — M25521 Pain in right elbow: Secondary | ICD-10-CM | POA: Diagnosis not present

## 2021-06-20 DIAGNOSIS — M6281 Muscle weakness (generalized): Secondary | ICD-10-CM | POA: Diagnosis not present

## 2021-06-20 DIAGNOSIS — M25631 Stiffness of right wrist, not elsewhere classified: Secondary | ICD-10-CM | POA: Diagnosis not present

## 2021-06-20 DIAGNOSIS — M25521 Pain in right elbow: Secondary | ICD-10-CM | POA: Diagnosis not present

## 2021-06-20 DIAGNOSIS — M25431 Effusion, right wrist: Secondary | ICD-10-CM | POA: Diagnosis not present

## 2021-06-26 DIAGNOSIS — M6281 Muscle weakness (generalized): Secondary | ICD-10-CM | POA: Diagnosis not present

## 2021-06-26 DIAGNOSIS — M25431 Effusion, right wrist: Secondary | ICD-10-CM | POA: Diagnosis not present

## 2021-06-26 DIAGNOSIS — M25521 Pain in right elbow: Secondary | ICD-10-CM | POA: Diagnosis not present

## 2021-06-26 DIAGNOSIS — M25631 Stiffness of right wrist, not elsewhere classified: Secondary | ICD-10-CM | POA: Diagnosis not present

## 2021-06-29 DIAGNOSIS — M25631 Stiffness of right wrist, not elsewhere classified: Secondary | ICD-10-CM | POA: Diagnosis not present

## 2021-06-29 DIAGNOSIS — M25521 Pain in right elbow: Secondary | ICD-10-CM | POA: Diagnosis not present

## 2021-06-29 DIAGNOSIS — M25431 Effusion, right wrist: Secondary | ICD-10-CM | POA: Diagnosis not present

## 2021-06-29 DIAGNOSIS — M6281 Muscle weakness (generalized): Secondary | ICD-10-CM | POA: Diagnosis not present

## 2021-07-02 DIAGNOSIS — M6281 Muscle weakness (generalized): Secondary | ICD-10-CM | POA: Diagnosis not present

## 2021-07-02 DIAGNOSIS — M25431 Effusion, right wrist: Secondary | ICD-10-CM | POA: Diagnosis not present

## 2021-07-02 DIAGNOSIS — M25631 Stiffness of right wrist, not elsewhere classified: Secondary | ICD-10-CM | POA: Diagnosis not present

## 2021-07-02 DIAGNOSIS — M25521 Pain in right elbow: Secondary | ICD-10-CM | POA: Diagnosis not present

## 2021-07-04 DIAGNOSIS — M25631 Stiffness of right wrist, not elsewhere classified: Secondary | ICD-10-CM | POA: Diagnosis not present

## 2021-07-04 DIAGNOSIS — M25431 Effusion, right wrist: Secondary | ICD-10-CM | POA: Diagnosis not present

## 2021-07-04 DIAGNOSIS — M6281 Muscle weakness (generalized): Secondary | ICD-10-CM | POA: Diagnosis not present

## 2021-07-04 DIAGNOSIS — M25521 Pain in right elbow: Secondary | ICD-10-CM | POA: Diagnosis not present

## 2021-07-09 DIAGNOSIS — M25631 Stiffness of right wrist, not elsewhere classified: Secondary | ICD-10-CM | POA: Diagnosis not present

## 2021-07-09 DIAGNOSIS — M6281 Muscle weakness (generalized): Secondary | ICD-10-CM | POA: Diagnosis not present

## 2021-07-09 DIAGNOSIS — M25431 Effusion, right wrist: Secondary | ICD-10-CM | POA: Diagnosis not present

## 2021-07-09 DIAGNOSIS — M25521 Pain in right elbow: Secondary | ICD-10-CM | POA: Diagnosis not present

## 2021-07-11 DIAGNOSIS — M25431 Effusion, right wrist: Secondary | ICD-10-CM | POA: Diagnosis not present

## 2021-07-11 DIAGNOSIS — M25631 Stiffness of right wrist, not elsewhere classified: Secondary | ICD-10-CM | POA: Diagnosis not present

## 2021-07-11 DIAGNOSIS — M25521 Pain in right elbow: Secondary | ICD-10-CM | POA: Diagnosis not present

## 2021-07-11 DIAGNOSIS — M6281 Muscle weakness (generalized): Secondary | ICD-10-CM | POA: Diagnosis not present

## 2021-07-18 DIAGNOSIS — M25631 Stiffness of right wrist, not elsewhere classified: Secondary | ICD-10-CM | POA: Diagnosis not present

## 2021-07-18 DIAGNOSIS — M6281 Muscle weakness (generalized): Secondary | ICD-10-CM | POA: Diagnosis not present

## 2021-07-18 DIAGNOSIS — M25431 Effusion, right wrist: Secondary | ICD-10-CM | POA: Diagnosis not present

## 2021-07-18 DIAGNOSIS — M25521 Pain in right elbow: Secondary | ICD-10-CM | POA: Diagnosis not present

## 2021-07-20 DIAGNOSIS — M25431 Effusion, right wrist: Secondary | ICD-10-CM | POA: Diagnosis not present

## 2021-07-20 DIAGNOSIS — M6281 Muscle weakness (generalized): Secondary | ICD-10-CM | POA: Diagnosis not present

## 2021-07-20 DIAGNOSIS — M25631 Stiffness of right wrist, not elsewhere classified: Secondary | ICD-10-CM | POA: Diagnosis not present

## 2021-07-20 DIAGNOSIS — M25521 Pain in right elbow: Secondary | ICD-10-CM | POA: Diagnosis not present

## 2021-07-23 DIAGNOSIS — M25521 Pain in right elbow: Secondary | ICD-10-CM | POA: Diagnosis not present

## 2021-07-23 DIAGNOSIS — M25631 Stiffness of right wrist, not elsewhere classified: Secondary | ICD-10-CM | POA: Diagnosis not present

## 2021-07-23 DIAGNOSIS — M6281 Muscle weakness (generalized): Secondary | ICD-10-CM | POA: Diagnosis not present

## 2021-07-23 DIAGNOSIS — M25431 Effusion, right wrist: Secondary | ICD-10-CM | POA: Diagnosis not present

## 2021-07-25 DIAGNOSIS — M25431 Effusion, right wrist: Secondary | ICD-10-CM | POA: Diagnosis not present

## 2021-07-25 DIAGNOSIS — M25521 Pain in right elbow: Secondary | ICD-10-CM | POA: Diagnosis not present

## 2021-07-25 DIAGNOSIS — M6281 Muscle weakness (generalized): Secondary | ICD-10-CM | POA: Diagnosis not present

## 2021-07-25 DIAGNOSIS — M25631 Stiffness of right wrist, not elsewhere classified: Secondary | ICD-10-CM | POA: Diagnosis not present

## 2021-08-01 DIAGNOSIS — M25631 Stiffness of right wrist, not elsewhere classified: Secondary | ICD-10-CM | POA: Diagnosis not present

## 2021-08-01 DIAGNOSIS — M25521 Pain in right elbow: Secondary | ICD-10-CM | POA: Diagnosis not present

## 2021-08-01 DIAGNOSIS — M25431 Effusion, right wrist: Secondary | ICD-10-CM | POA: Diagnosis not present

## 2021-08-01 DIAGNOSIS — M6281 Muscle weakness (generalized): Secondary | ICD-10-CM | POA: Diagnosis not present

## 2021-08-06 DIAGNOSIS — M6281 Muscle weakness (generalized): Secondary | ICD-10-CM | POA: Diagnosis not present

## 2021-08-06 DIAGNOSIS — M25431 Effusion, right wrist: Secondary | ICD-10-CM | POA: Diagnosis not present

## 2021-08-06 DIAGNOSIS — M25631 Stiffness of right wrist, not elsewhere classified: Secondary | ICD-10-CM | POA: Diagnosis not present

## 2021-08-06 DIAGNOSIS — M25521 Pain in right elbow: Secondary | ICD-10-CM | POA: Diagnosis not present

## 2021-08-08 DIAGNOSIS — M25431 Effusion, right wrist: Secondary | ICD-10-CM | POA: Diagnosis not present

## 2021-08-08 DIAGNOSIS — M25631 Stiffness of right wrist, not elsewhere classified: Secondary | ICD-10-CM | POA: Diagnosis not present

## 2021-08-08 DIAGNOSIS — M25521 Pain in right elbow: Secondary | ICD-10-CM | POA: Diagnosis not present

## 2021-08-08 DIAGNOSIS — M6281 Muscle weakness (generalized): Secondary | ICD-10-CM | POA: Diagnosis not present

## 2021-08-12 ENCOUNTER — Other Ambulatory Visit: Payer: Self-pay | Admitting: Cardiovascular Disease

## 2021-08-13 DIAGNOSIS — M25521 Pain in right elbow: Secondary | ICD-10-CM | POA: Diagnosis not present

## 2021-08-13 DIAGNOSIS — M25631 Stiffness of right wrist, not elsewhere classified: Secondary | ICD-10-CM | POA: Diagnosis not present

## 2021-08-13 DIAGNOSIS — M25431 Effusion, right wrist: Secondary | ICD-10-CM | POA: Diagnosis not present

## 2021-08-13 DIAGNOSIS — M6281 Muscle weakness (generalized): Secondary | ICD-10-CM | POA: Diagnosis not present

## 2021-08-14 DIAGNOSIS — E669 Obesity, unspecified: Secondary | ICD-10-CM | POA: Diagnosis not present

## 2021-08-14 DIAGNOSIS — Z9181 History of falling: Secondary | ICD-10-CM | POA: Diagnosis not present

## 2021-08-14 DIAGNOSIS — Z6832 Body mass index (BMI) 32.0-32.9, adult: Secondary | ICD-10-CM | POA: Diagnosis not present

## 2021-08-14 DIAGNOSIS — Z1331 Encounter for screening for depression: Secondary | ICD-10-CM | POA: Diagnosis not present

## 2021-08-14 DIAGNOSIS — Z0000 Encounter for general adult medical examination without abnormal findings: Secondary | ICD-10-CM | POA: Diagnosis not present

## 2021-08-14 DIAGNOSIS — E785 Hyperlipidemia, unspecified: Secondary | ICD-10-CM | POA: Diagnosis not present

## 2021-08-15 DIAGNOSIS — M25521 Pain in right elbow: Secondary | ICD-10-CM | POA: Diagnosis not present

## 2021-08-15 DIAGNOSIS — M25431 Effusion, right wrist: Secondary | ICD-10-CM | POA: Diagnosis not present

## 2021-08-15 DIAGNOSIS — M25631 Stiffness of right wrist, not elsewhere classified: Secondary | ICD-10-CM | POA: Diagnosis not present

## 2021-08-15 DIAGNOSIS — M6281 Muscle weakness (generalized): Secondary | ICD-10-CM | POA: Diagnosis not present

## 2021-08-20 DIAGNOSIS — S90812A Abrasion, left foot, initial encounter: Secondary | ICD-10-CM | POA: Diagnosis not present

## 2021-08-20 DIAGNOSIS — M79672 Pain in left foot: Secondary | ICD-10-CM | POA: Diagnosis not present

## 2021-08-20 DIAGNOSIS — M25431 Effusion, right wrist: Secondary | ICD-10-CM | POA: Diagnosis not present

## 2021-08-20 DIAGNOSIS — M25521 Pain in right elbow: Secondary | ICD-10-CM | POA: Diagnosis not present

## 2021-08-20 DIAGNOSIS — M6281 Muscle weakness (generalized): Secondary | ICD-10-CM | POA: Diagnosis not present

## 2021-08-20 DIAGNOSIS — M25631 Stiffness of right wrist, not elsewhere classified: Secondary | ICD-10-CM | POA: Diagnosis not present

## 2021-08-23 DIAGNOSIS — M6281 Muscle weakness (generalized): Secondary | ICD-10-CM | POA: Diagnosis not present

## 2021-08-23 DIAGNOSIS — M25631 Stiffness of right wrist, not elsewhere classified: Secondary | ICD-10-CM | POA: Diagnosis not present

## 2021-08-23 DIAGNOSIS — M25431 Effusion, right wrist: Secondary | ICD-10-CM | POA: Diagnosis not present

## 2021-08-23 DIAGNOSIS — M25521 Pain in right elbow: Secondary | ICD-10-CM | POA: Diagnosis not present

## 2021-08-27 DIAGNOSIS — M6281 Muscle weakness (generalized): Secondary | ICD-10-CM | POA: Diagnosis not present

## 2021-08-27 DIAGNOSIS — M25431 Effusion, right wrist: Secondary | ICD-10-CM | POA: Diagnosis not present

## 2021-08-27 DIAGNOSIS — M25521 Pain in right elbow: Secondary | ICD-10-CM | POA: Diagnosis not present

## 2021-08-27 DIAGNOSIS — M25631 Stiffness of right wrist, not elsewhere classified: Secondary | ICD-10-CM | POA: Diagnosis not present

## 2021-08-29 DIAGNOSIS — M25431 Effusion, right wrist: Secondary | ICD-10-CM | POA: Diagnosis not present

## 2021-08-29 DIAGNOSIS — M25521 Pain in right elbow: Secondary | ICD-10-CM | POA: Diagnosis not present

## 2021-08-29 DIAGNOSIS — M25631 Stiffness of right wrist, not elsewhere classified: Secondary | ICD-10-CM | POA: Diagnosis not present

## 2021-08-29 DIAGNOSIS — M6281 Muscle weakness (generalized): Secondary | ICD-10-CM | POA: Diagnosis not present

## 2021-08-31 DIAGNOSIS — M19131 Post-traumatic osteoarthritis, right wrist: Secondary | ICD-10-CM | POA: Diagnosis not present

## 2021-10-03 DIAGNOSIS — R109 Unspecified abdominal pain: Secondary | ICD-10-CM | POA: Diagnosis not present

## 2021-10-03 DIAGNOSIS — S76211A Strain of adductor muscle, fascia and tendon of right thigh, initial encounter: Secondary | ICD-10-CM | POA: Diagnosis not present

## 2021-10-03 DIAGNOSIS — R1031 Right lower quadrant pain: Secondary | ICD-10-CM | POA: Diagnosis not present

## 2021-10-05 DIAGNOSIS — N281 Cyst of kidney, acquired: Secondary | ICD-10-CM | POA: Diagnosis not present

## 2021-10-05 DIAGNOSIS — M545 Low back pain, unspecified: Secondary | ICD-10-CM | POA: Diagnosis not present

## 2021-10-05 DIAGNOSIS — R1030 Lower abdominal pain, unspecified: Secondary | ICD-10-CM | POA: Diagnosis not present

## 2021-10-05 DIAGNOSIS — M791 Myalgia, unspecified site: Secondary | ICD-10-CM | POA: Diagnosis not present

## 2021-10-05 DIAGNOSIS — K760 Fatty (change of) liver, not elsewhere classified: Secondary | ICD-10-CM | POA: Diagnosis not present

## 2021-10-05 DIAGNOSIS — R1031 Right lower quadrant pain: Secondary | ICD-10-CM | POA: Diagnosis not present

## 2021-10-05 DIAGNOSIS — M4317 Spondylolisthesis, lumbosacral region: Secondary | ICD-10-CM | POA: Diagnosis not present

## 2021-10-05 DIAGNOSIS — K5730 Diverticulosis of large intestine without perforation or abscess without bleeding: Secondary | ICD-10-CM | POA: Diagnosis not present

## 2021-10-25 DIAGNOSIS — Z96641 Presence of right artificial hip joint: Secondary | ICD-10-CM | POA: Diagnosis not present

## 2021-10-31 DIAGNOSIS — H2513 Age-related nuclear cataract, bilateral: Secondary | ICD-10-CM | POA: Diagnosis not present

## 2021-10-31 DIAGNOSIS — H524 Presbyopia: Secondary | ICD-10-CM | POA: Diagnosis not present

## 2021-10-31 DIAGNOSIS — H18413 Arcus senilis, bilateral: Secondary | ICD-10-CM | POA: Diagnosis not present

## 2021-10-31 DIAGNOSIS — H43392 Other vitreous opacities, left eye: Secondary | ICD-10-CM | POA: Diagnosis not present

## 2021-11-07 DIAGNOSIS — Z23 Encounter for immunization: Secondary | ICD-10-CM | POA: Diagnosis not present

## 2021-11-28 DIAGNOSIS — C61 Malignant neoplasm of prostate: Secondary | ICD-10-CM | POA: Diagnosis not present

## 2021-11-28 DIAGNOSIS — N529 Male erectile dysfunction, unspecified: Secondary | ICD-10-CM | POA: Diagnosis not present

## 2021-11-28 DIAGNOSIS — N399 Disorder of urinary system, unspecified: Secondary | ICD-10-CM | POA: Diagnosis not present

## 2021-12-24 ENCOUNTER — Other Ambulatory Visit: Payer: Self-pay | Admitting: Cardiovascular Disease

## 2021-12-29 DIAGNOSIS — Z20822 Contact with and (suspected) exposure to covid-19: Secondary | ICD-10-CM | POA: Diagnosis not present

## 2022-01-21 ENCOUNTER — Other Ambulatory Visit: Payer: Self-pay | Admitting: Cardiovascular Disease
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
## 2022-02-05 DIAGNOSIS — D1801 Hemangioma of skin and subcutaneous tissue: Secondary | ICD-10-CM | POA: Diagnosis not present

## 2022-02-05 DIAGNOSIS — L570 Actinic keratosis: Secondary | ICD-10-CM | POA: Diagnosis not present

## 2022-02-05 DIAGNOSIS — L820 Inflamed seborrheic keratosis: Secondary | ICD-10-CM | POA: Diagnosis not present

## 2022-02-05 DIAGNOSIS — L578 Other skin changes due to chronic exposure to nonionizing radiation: Secondary | ICD-10-CM | POA: Diagnosis not present

## 2022-02-05 DIAGNOSIS — L821 Other seborrheic keratosis: Secondary | ICD-10-CM | POA: Diagnosis not present

## 2022-02-11 ENCOUNTER — Other Ambulatory Visit: Payer: Self-pay

## 2022-02-11 ENCOUNTER — Encounter: Payer: Self-pay | Admitting: Cardiovascular Disease

## 2022-02-11 ENCOUNTER — Ambulatory Visit (INDEPENDENT_AMBULATORY_CARE_PROVIDER_SITE_OTHER): Payer: Medicare Other | Admitting: Cardiovascular Disease

## 2022-02-11 ENCOUNTER — Telehealth: Payer: Self-pay

## 2022-02-11 VITALS — BP 112/72 | HR 58 | Ht 72.0 in | Wt 245.0 lb

## 2022-02-11 DIAGNOSIS — I10 Essential (primary) hypertension: Secondary | ICD-10-CM

## 2022-02-11 DIAGNOSIS — Z951 Presence of aortocoronary bypass graft: Secondary | ICD-10-CM | POA: Diagnosis not present

## 2022-02-11 DIAGNOSIS — E785 Hyperlipidemia, unspecified: Secondary | ICD-10-CM

## 2022-02-11 MED ORDER — METOPROLOL TARTRATE 50 MG PO TABS: ORAL_TABLET | ORAL | 3 refills | Status: DC

## 2022-02-11 MED ORDER — HYDROCHLOROTHIAZIDE 25 MG PO TABS: 25.0000 mg | ORAL_TABLET | Freq: Every day | ORAL | 3 refills | Status: DC

## 2022-02-11 MED ORDER — SPIRONOLACTONE 25 MG PO TABS: 25.0000 mg | ORAL_TABLET | Freq: Every day | ORAL | 3 refills | Status: DC

## 2022-02-11 MED ORDER — ENALAPRIL MALEATE 20 MG PO TABS: 30.0000 mg | ORAL_TABLET | Freq: Every day | ORAL | 3 refills | Status: DC

## 2022-02-11 NOTE — Telephone Encounter (Signed)
-----   Message from Ramond Dial, [place] sent at 02/11/2022  2:44 PM EDT ----- ?Will you please renew his healthwell grant, call it into the pharmacy and call patient. ?thanks ?----- Message ----- ?From: Josue Hector, MD ?Sent: 02/11/2022   9:09 AM EDT ?To: Deliah Boston Via, LPN, Leeroy Bock, RPH-CPP ? ?Can we look into cost of his Repatha ? Fatima Sanger ran out he stopped taking in December when cost him $500 ? ?

## 2022-02-11 NOTE — Patient Instructions (Signed)
Medication Instructions:  ?Your physician recommends that you continue on your current medications as directed. Please refer to the Current Medication list given to you today. ? ?*If you need a refill on your cardiac medications before your next appointment, please call your pharmacy* ? ?Lab Work: ?If you have labs (blood work) drawn today and your tests are completely normal, you will receive your results only by: ?MyChart Message (if you have MyChart) OR ?A paper copy in the mail ?If you have any lab test that is abnormal or we need to change your treatment, we will call you to review the results. ? ?Testing/Procedures: ?None ordered today. ? ?Follow-Up: ?At [facility], you and your health needs are our priority.  As part of our continuing mission to provide you with exceptional heart care, we have created designated Provider Care Teams.  These Care Teams include your primary Cardiologist (physician) and Advanced Practice Providers (APPs -  Physician Assistants and Nurse Practitioners) who all work together to provide you with the care you need, when you need it. ? ?We recommend signing up for the patient portal called "MyChart".  Sign up information is provided on this After Visit Summary.  MyChart is used to connect with patients for Virtual Visits (Telemedicine).  Patients are able to view lab/test results, encounter notes, upcoming appointments, etc.  Non-urgent messages can be sent to your provider as well.   ?To learn more about what you can do with MyChart, go to [email or web address].   ? ?Your next appointment:   ?1 year(s) ? ?The format for your next appointment:   ?In Person ? ?Provider:   ?[name], MD { ? ? ?

## 2022-02-11 NOTE — Telephone Encounter (Signed)
[employer] grant given to pt by phone since I tried calling pharmacy and it went straight to provider voicemail: ?Pharmacy Card ?CARD NO. ?[id number] ?  ?CARD STATUS ?Active ?  ?BIN ?[id number] ?  ?PCN ?[id number] ?  ?PC GROUP ?[id number] ?  ?HELP DESK ?[phone] ?  ?PROVIDER ?PDMI ?  ?PROCESSOR ?PDMI ?

## 2022-02-11 NOTE — Addendum Note (Signed)
Addended by: Aris Georgia, Javen Hinderliter L on: 02/11/2022 09:13 AM ? ? Modules accepted: Orders ? ?

## 2022-02-12 ENCOUNTER — Other Ambulatory Visit: Payer: Self-pay | Admitting: Cardiovascular Disease

## 2022-03-15 DIAGNOSIS — U071 COVID-19: Secondary | ICD-10-CM | POA: Diagnosis not present

## 2022-03-18 DIAGNOSIS — Z20822 Contact with and (suspected) exposure to covid-19: Secondary | ICD-10-CM | POA: Diagnosis not present

## 2022-04-03 DIAGNOSIS — Z20822 Contact with and (suspected) exposure to covid-19: Secondary | ICD-10-CM | POA: Diagnosis not present

## 2022-10-29 DIAGNOSIS — G43109 Migraine with aura, not intractable, without status migrainosus: Secondary | ICD-10-CM | POA: Diagnosis not present

## 2022-10-29 DIAGNOSIS — H18413 Arcus senilis, bilateral: Secondary | ICD-10-CM | POA: Diagnosis not present

## 2022-10-29 DIAGNOSIS — H2513 Age-related nuclear cataract, bilateral: Secondary | ICD-10-CM | POA: Diagnosis not present

## 2022-10-29 DIAGNOSIS — H43392 Other vitreous opacities, left eye: Secondary | ICD-10-CM | POA: Diagnosis not present
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
## 2022-11-08 ENCOUNTER — Ambulatory Visit: Payer: Medicare Other | Attending: Cardiovascular Disease | Admitting: Cardiovascular Disease

## 2022-11-08 ENCOUNTER — Encounter: Payer: Self-pay | Admitting: Cardiovascular Disease

## 2022-11-08 VITALS — BP 110/60 | HR 61 | Ht 72.0 in | Wt 231.8 lb

## 2022-11-08 DIAGNOSIS — E785 Hyperlipidemia, unspecified: Secondary | ICD-10-CM | POA: Insufficient documentation

## 2022-11-08 NOTE — Patient Instructions (Signed)
Medication Instructions:  Your physician recommends that you continue on your current medications as directed. Please refer to the Current Medication list given to you today.  *If you need a refill on your cardiac medications before your next appointment, please call your pharmacy*  Lab Work: If you have labs (blood work) drawn today and your tests are completely normal, you will receive your results only by: [place] (if you have MyChart) OR A paper copy in the mail If you have any lab test that is abnormal or we need to change your treatment, we will call you to review the results.  Testing/Procedures: Your physician has requested that you have a carotid duplex. This test is an ultrasound of the carotid arteries in your neck. It looks at blood flow through these arteries that supply the brain with blood. Allow one hour for this exam. There are no restrictions or special instructions.  Follow-Up: At [facility], you and your health needs are our priority.  As part of our continuing mission to provide you with exceptional heart care, we have created designated Provider Care Teams.  These Care Teams include your primary Cardiologist (physician) and Advanced Practice Providers (APPs -  Physician Assistants and Nurse Practitioners) who all work together to provide you with the care you need, when you need it.  We recommend signing up for the patient portal called "MyChart".  Sign up information is provided on this After Visit Summary.  MyChart is used to connect with patients for Virtual Visits (Telemedicine).  Patients are able to view lab/test results, encounter notes, upcoming appointments, etc.  Non-urgent messages can be sent to your provider as well.   To learn more about what you can do with MyChart, go to [email or web address].    Your next appointment:   1 year(s)  The format for your next appointment:   In Person  Provider:   [name], MD     Important  Information About [place]

## 2022-11-12 ENCOUNTER — Ambulatory Visit (HOSPITAL_COMMUNITY)
Admission: RE | Admit: 2022-11-12 | Discharge: 2022-11-12 | Disposition: A | Discharge status: Home / Self Care | Payer: Medicare Other | Source: Ambulatory Visit | Attending: Cardiology | Admitting: Cardiology

## 2022-11-12 DIAGNOSIS — E785 Hyperlipidemia, unspecified: Secondary | ICD-10-CM | POA: Insufficient documentation

## 2022-11-22 DIAGNOSIS — J208 Acute bronchitis due to other specified organisms: Secondary | ICD-10-CM | POA: Diagnosis not present

## 2022-11-29 DIAGNOSIS — C61 Malignant neoplasm of prostate: Secondary | ICD-10-CM | POA: Diagnosis not present

## 2022-12-07 DIAGNOSIS — K5732 Diverticulitis of large intestine without perforation or abscess without bleeding: Secondary | ICD-10-CM | POA: Diagnosis not present

## 2022-12-13 ENCOUNTER — Other Ambulatory Visit (HOSPITAL_BASED_OUTPATIENT_CLINIC_OR_DEPARTMENT_OTHER): Payer: Self-pay | Admitting: Cardiovascular Disease

## 2022-12-13 DIAGNOSIS — I6523 Occlusion and stenosis of bilateral carotid arteries: Secondary | ICD-10-CM

## 2023-01-13 ENCOUNTER — Other Ambulatory Visit: Payer: Self-pay | Admitting: Cardiovascular Disease

## 2023-01-13 DIAGNOSIS — E785 Hyperlipidemia, unspecified: Secondary | ICD-10-CM

## 2023-01-13 DIAGNOSIS — I25700 Atherosclerosis of coronary artery bypass graft(s), unspecified, with unstable angina pectoris: Secondary | ICD-10-CM

## 2023-01-23 ENCOUNTER — Telehealth: Payer: Self-pay | Admitting: Cardiovascular Disease

## 2023-01-23 NOTE — Telephone Encounter (Signed)
Pt c/o medication issue: 1. Name of Medication:   Alirocumab (PRALUENT) 150 MG/ML SOAJ    2. How are you currently taking this medication (dosage and times per day)? INJECT 1 PEN INTO THE SKIN EVERY 14 (FOURTEEN) DAYS.   3. Are you having a reaction (difficulty breathing--STAT)? No  4. What is your medication issue? Pt would ike a callback regarding Fatima Sanger for medication. He states that when he went to pick up medication it was almost $400.00. Please advise

## 2023-01-24 NOTE — Telephone Encounter (Signed)
Spoke with patient and updated [place]  ID [id number] Exp 01/12/2024

## 2023-02-01 ENCOUNTER — Other Ambulatory Visit: Payer: Self-pay | Admitting: Cardiovascular Disease

## 2023-02-04 DIAGNOSIS — L570 Actinic keratosis: Secondary | ICD-10-CM | POA: Diagnosis not present

## 2023-02-04 DIAGNOSIS — Z08 Encounter for follow-up examination after completed treatment for malignant neoplasm: Secondary | ICD-10-CM | POA: Diagnosis not present

## 2023-02-04 DIAGNOSIS — Z85828 Personal history of other malignant neoplasm of skin: Secondary | ICD-10-CM | POA: Diagnosis not present

## 2023-02-04 DIAGNOSIS — D1801 Hemangioma of skin and subcutaneous tissue: Secondary | ICD-10-CM | POA: Diagnosis not present

## 2023-02-04 DIAGNOSIS — L578 Other skin changes due to chronic exposure to nonionizing radiation: Secondary | ICD-10-CM | POA: Diagnosis not present

## 2023-02-04 DIAGNOSIS — L821 Other seborrheic keratosis: Secondary | ICD-10-CM | POA: Diagnosis not present

## 2023-02-14 ENCOUNTER — Other Ambulatory Visit: Payer: Self-pay | Admitting: Cardiovascular Disease

## 2023-02-21 ENCOUNTER — Other Ambulatory Visit: Payer: Self-pay | Admitting: Cardiovascular Disease

## 2023-07-03 DIAGNOSIS — B9689 Other specified bacterial agents as the cause of diseases classified elsewhere: Secondary | ICD-10-CM | POA: Diagnosis not present

## 2023-07-03 DIAGNOSIS — J208 Acute bronchitis due to other specified organisms: Secondary | ICD-10-CM | POA: Diagnosis not present

## 2023-07-15 ENCOUNTER — Other Ambulatory Visit: Payer: Self-pay | Admitting: Cardiovascular Disease

## 2023-07-15 DIAGNOSIS — I25700 Atherosclerosis of coronary artery bypass graft(s), unspecified, with unstable angina pectoris: Secondary | ICD-10-CM

## 2023-07-15 DIAGNOSIS — E785 Hyperlipidemia, unspecified: Secondary | ICD-10-CM

## 2023-08-24 ENCOUNTER — Other Ambulatory Visit: Payer: Self-pay | Admitting: Cardiovascular Disease

## 2023-08-27 DIAGNOSIS — Z8546 Personal history of malignant neoplasm of prostate: Secondary | ICD-10-CM | POA: Diagnosis not present

## 2023-08-27 DIAGNOSIS — R319 Hematuria, unspecified: Secondary | ICD-10-CM | POA: Diagnosis not present

## 2023-09-03 ENCOUNTER — Other Ambulatory Visit: Payer: Self-pay | Admitting: Cardiovascular Disease

## 2023-09-20 ENCOUNTER — Other Ambulatory Visit: Payer: Self-pay | Admitting: Cardiovascular Disease

## 2023-11-14 ENCOUNTER — Ambulatory Visit (HOSPITAL_COMMUNITY)
Admission: RE | Admit: 2023-11-14 | Discharge: 2023-11-14 | Disposition: A | Discharge status: Home / Self Care | Payer: Medicare Other | Source: Ambulatory Visit | Attending: Cardiovascular Disease | Admitting: Cardiovascular Disease

## 2023-11-14 DIAGNOSIS — I6523 Occlusion and stenosis of bilateral carotid arteries: Secondary | ICD-10-CM | POA: Diagnosis not present

## 2023-11-14 DIAGNOSIS — B9689 Other specified bacterial agents as the cause of diseases classified elsewhere: Secondary | ICD-10-CM | POA: Diagnosis not present

## 2023-11-14 DIAGNOSIS — J019 Acute sinusitis, unspecified: Secondary | ICD-10-CM | POA: Diagnosis not present
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
## 2023-11-28 DIAGNOSIS — N529 Male erectile dysfunction, unspecified: Secondary | ICD-10-CM | POA: Diagnosis not present

## 2023-11-28 DIAGNOSIS — R399 Unspecified symptoms and signs involving the genitourinary system: Secondary | ICD-10-CM | POA: Diagnosis not present

## 2023-11-28 DIAGNOSIS — Z87891 Personal history of nicotine dependence: Secondary | ICD-10-CM | POA: Diagnosis not present

## 2023-11-28 DIAGNOSIS — C61 Malignant neoplasm of prostate: Secondary | ICD-10-CM | POA: Diagnosis not present

## 2023-11-28 DIAGNOSIS — Z8546 Personal history of malignant neoplasm of prostate: Secondary | ICD-10-CM | POA: Diagnosis not present

## 2023-11-28 DIAGNOSIS — Z923 Personal history of irradiation: Secondary | ICD-10-CM | POA: Diagnosis not present

## 2023-12-03 ENCOUNTER — Ambulatory Visit: Payer: Medicare Other | Attending: Cardiovascular Disease | Admitting: Cardiovascular Disease

## 2023-12-03 VITALS — BP 112/66 | HR 54 | Resp 16 | Ht 72.0 in | Wt 228.6 lb

## 2023-12-03 DIAGNOSIS — Z951 Presence of aortocoronary bypass graft: Secondary | ICD-10-CM | POA: Diagnosis not present

## 2023-12-03 DIAGNOSIS — E785 Hyperlipidemia, unspecified: Secondary | ICD-10-CM | POA: Insufficient documentation

## 2023-12-03 DIAGNOSIS — I10 Essential (primary) hypertension: Secondary | ICD-10-CM | POA: Insufficient documentation

## 2023-12-03 NOTE — Patient Instructions (Signed)
 Medication Instructions:  Your physician recommends that you continue on your current medications as directed. Please refer to the Current Medication list given to you today.  *If you need a refill on your cardiac medications before your next appointment, please call your pharmacy*   Lab Work: If you have labs (blood work) drawn today and your tests are completely normal, you will receive your results only by: MyChart Message (if you have MyChart) OR A paper copy in the mail If you have any lab test that is abnormal or we need to change your treatment, we will call you to review the results.   Testing/Procedures: None   Follow-Up: At [facility], you and your health needs are our priority.  As part of our continuing mission to provide you with exceptional heart care, we have created designated Provider Care Teams.  These Care Teams include your primary Cardiologist (physician) and Advanced Practice Providers (APPs -  Physician Assistants and Nurse Practitioners) who all work together to provide you with the care you need, when you need it.  We recommend signing up for the patient portal called MyChart.  Sign up information is provided on this After Visit Summary.  MyChart is used to connect with patients for Virtual Visits (Telemedicine).  Patients are able to view lab/test results, encounter notes, upcoming appointments, etc.  Non-urgent messages can be sent to your provider as well.   To learn more about what you can do with MyChart, go to [email or web address].    Your next appointment:   1 year(s)  Provider:   Maude Emmer, MD

## 2023-12-04 ENCOUNTER — Telehealth: Payer: Self-pay | Admitting: Pharmacist

## 2023-12-04 NOTE — Telephone Encounter (Signed)
 Received request from provider to make sure [employer] grant is renewed. Grant good through 01/12/24. Too soon to renew. Will renew about 2 weeks before it expires.  Patient made aware.

## 2023-12-29 DIAGNOSIS — B9689 Other specified bacterial agents as the cause of diseases classified elsewhere: Secondary | ICD-10-CM | POA: Diagnosis not present

## 2023-12-29 DIAGNOSIS — J208 Acute bronchitis due to other specified organisms: Secondary | ICD-10-CM | POA: Diagnosis not present

## 2023-12-29 NOTE — Telephone Encounter (Signed)
[employer]well grant renewed. Pt called and provided with new information.  Card No. [id number]  BIN [id number]  PCN [id number]  PC Group [id number]

## 2023-12-31 ENCOUNTER — Other Ambulatory Visit: Payer: Self-pay | Admitting: Cardiovascular Disease

## 2024-02-09 DIAGNOSIS — L821 Other seborrheic keratosis: Secondary | ICD-10-CM | POA: Diagnosis not present

## 2024-02-09 DIAGNOSIS — D1801 Hemangioma of skin and subcutaneous tissue: Secondary | ICD-10-CM | POA: Diagnosis not present

## 2024-02-09 DIAGNOSIS — L578 Other skin changes due to chronic exposure to nonionizing radiation: Secondary | ICD-10-CM | POA: Diagnosis not present

## 2024-02-09 DIAGNOSIS — L570 Actinic keratosis: Secondary | ICD-10-CM | POA: Diagnosis not present

## 2024-04-06 DIAGNOSIS — Z9181 History of falling: Secondary | ICD-10-CM | POA: Diagnosis not present

## 2024-04-06 DIAGNOSIS — Z1331 Encounter for screening for depression: Secondary | ICD-10-CM | POA: Diagnosis not present

## 2024-04-06 DIAGNOSIS — Z0000 Encounter for general adult medical examination without abnormal findings: Secondary | ICD-10-CM | POA: Diagnosis not present

## 2024-06-30 DIAGNOSIS — B351 Tinea unguium: Secondary | ICD-10-CM | POA: Diagnosis not present

## 2024-06-30 DIAGNOSIS — L03031 Cellulitis of right toe: Secondary | ICD-10-CM | POA: Diagnosis not present

## 2024-08-02 ENCOUNTER — Other Ambulatory Visit: Payer: Self-pay

## 2024-08-04 MED ORDER — ENALAPRIL MALEATE 20 MG PO TABS: 30.0000 mg | ORAL_TABLET | Freq: Every day | ORAL | 1 refills | Status: AC

## 2024-08-09 ENCOUNTER — Other Ambulatory Visit: Payer: Self-pay | Admitting: Cardiovascular Disease

## 2024-08-10 MED ORDER — SPIRONOLACTONE 25 MG PO TABS: 25.0000 mg | ORAL_TABLET | Freq: Every day | ORAL | 1 refills | Status: AC

## 2024-11-05 ENCOUNTER — Encounter: Payer: Self-pay | Admitting: Cardiovascular Disease

## 2025-03-01 ENCOUNTER — Ambulatory Visit: Admitting: Cardiovascular Disease
